# Patient Record
Sex: Female | Born: 1980 | Hispanic: Refuse to answer | Marital: Single | Smoking: Never smoker
Health system: Southern US, Community
[De-identification: ages and names within clinical notes are randomized; demographics above are authoritative.]

## PROBLEM LIST (undated history)

## (undated) DIAGNOSIS — D649 Anemia, unspecified: Secondary | ICD-10-CM

## (undated) DIAGNOSIS — O24419 Gestational diabetes mellitus in pregnancy, unspecified control: Secondary | ICD-10-CM

## (undated) DIAGNOSIS — O139 Gestational [pregnancy-induced] hypertension without significant proteinuria, unspecified trimester: Secondary | ICD-10-CM

## (undated) DIAGNOSIS — F419 Anxiety disorder, unspecified: Secondary | ICD-10-CM

## (undated) DIAGNOSIS — Z8632 Personal history of gestational diabetes: Secondary | ICD-10-CM

## (undated) HISTORY — PX: LEEP: SHX91

## (undated) HISTORY — PX: TONSILLECTOMY: SUR1361

## (undated) HISTORY — DX: Anxiety disorder, unspecified: F41.9

## (undated) HISTORY — PX: BREAST REDUCTION SURGERY: SHX8

## (undated) HISTORY — PX: BREAST SURGERY: SHX581

---

## 2015-12-22 ENCOUNTER — Other Ambulatory Visit (HOSPITAL_COMMUNITY)
Admission: RE | Admit: 2015-12-22 | Discharge: 2015-12-22 | Disposition: A | Discharge status: Home / Self Care | Payer: Managed Care, Other (non HMO) | Source: Ambulatory Visit | Attending: Family Medicine | Admitting: Family Medicine

## 2015-12-22 DIAGNOSIS — Z01411 Encounter for gynecological examination (general) (routine) with abnormal findings: Secondary | ICD-10-CM | POA: Diagnosis not present

## 2016-03-02 ENCOUNTER — Other Ambulatory Visit: Payer: Self-pay | Admitting: Family Medicine

## 2016-03-02 DIAGNOSIS — R110 Nausea: Secondary | ICD-10-CM

## 2016-03-02 DIAGNOSIS — R1011 Right upper quadrant pain: Secondary | ICD-10-CM

## 2016-03-09 ENCOUNTER — Ambulatory Visit
Admission: RE | Admit: 2016-03-09 | Discharge: 2016-03-09 | Disposition: A | Discharge status: Home / Self Care | Payer: Managed Care, Other (non HMO) | Source: Ambulatory Visit | Attending: Family Medicine | Admitting: Family Medicine

## 2016-03-09 DIAGNOSIS — R110 Nausea: Secondary | ICD-10-CM

## 2016-03-09 DIAGNOSIS — R1011 Right upper quadrant pain: Secondary | ICD-10-CM

## 2016-03-15 ENCOUNTER — Other Ambulatory Visit: Payer: Managed Care, Other (non HMO)

## 2017-04-04 DIAGNOSIS — F411 Generalized anxiety disorder: Secondary | ICD-10-CM | POA: Diagnosis not present

## 2017-04-04 DIAGNOSIS — I8391 Asymptomatic varicose veins of right lower extremity: Secondary | ICD-10-CM | POA: Diagnosis not present

## 2017-04-04 DIAGNOSIS — K219 Gastro-esophageal reflux disease without esophagitis: Secondary | ICD-10-CM | POA: Diagnosis not present

## 2017-04-04 DIAGNOSIS — M25561 Pain in right knee: Secondary | ICD-10-CM | POA: Diagnosis not present

## 2017-04-28 DIAGNOSIS — K219 Gastro-esophageal reflux disease without esophagitis: Secondary | ICD-10-CM | POA: Diagnosis not present

## 2017-04-28 DIAGNOSIS — Z0000 Encounter for general adult medical examination without abnormal findings: Secondary | ICD-10-CM | POA: Diagnosis not present

## 2017-04-28 DIAGNOSIS — K760 Fatty (change of) liver, not elsewhere classified: Secondary | ICD-10-CM | POA: Diagnosis not present

## 2017-04-28 DIAGNOSIS — R635 Abnormal weight gain: Secondary | ICD-10-CM | POA: Diagnosis not present

## 2017-04-28 DIAGNOSIS — F411 Generalized anxiety disorder: Secondary | ICD-10-CM | POA: Diagnosis not present

## 2017-04-28 DIAGNOSIS — N920 Excessive and frequent menstruation with regular cycle: Secondary | ICD-10-CM | POA: Diagnosis not present

## 2017-04-28 DIAGNOSIS — R7301 Impaired fasting glucose: Secondary | ICD-10-CM | POA: Diagnosis not present

## 2017-05-14 DIAGNOSIS — J029 Acute pharyngitis, unspecified: Secondary | ICD-10-CM | POA: Diagnosis not present

## 2017-05-14 DIAGNOSIS — J069 Acute upper respiratory infection, unspecified: Secondary | ICD-10-CM | POA: Diagnosis not present

## 2017-05-26 DIAGNOSIS — J209 Acute bronchitis, unspecified: Secondary | ICD-10-CM | POA: Diagnosis not present

## 2017-09-05 NOTE — L&D Delivery Note (Signed)
Delivery Note At 9:33 PM a viable female was delivered via Vaginal, Spontaneous (Presentation:occiput anterior  ; Nuchal cord x1 reduced.  ).  APGAR: 9, 9; weight pending.  .   Placenta status: complete  , 3 vessel .  Cord:  with the following complications: None.  Cord pH: NA  Anesthesia:  Epidural  Episiotomy:  None Lacerations: 2nd degree;Perineal Suture Repair: 3.0 vicryl Est. Blood Loss (mL): 200 mL   Mom to postpartum.  Baby to Couplet care / Skin to Skin.  Aaminah Forrester J. 05/14/2018, 9:57 PM

## 2017-09-07 DIAGNOSIS — L209 Atopic dermatitis, unspecified: Secondary | ICD-10-CM | POA: Diagnosis not present

## 2017-10-06 DIAGNOSIS — Z3481 Encounter for supervision of other normal pregnancy, first trimester: Secondary | ICD-10-CM | POA: Diagnosis not present

## 2017-10-06 DIAGNOSIS — Z3480 Encounter for supervision of other normal pregnancy, unspecified trimester: Secondary | ICD-10-CM | POA: Diagnosis not present

## 2017-10-24 ENCOUNTER — Other Ambulatory Visit (HOSPITAL_COMMUNITY)
Admission: RE | Admit: 2017-10-24 | Discharge: 2017-10-24 | Disposition: A | Discharge status: Home / Self Care | Payer: Medicaid Other | Source: Ambulatory Visit | Attending: Obstetrics and Gynecology | Admitting: Obstetrics and Gynecology

## 2017-10-24 ENCOUNTER — Other Ambulatory Visit: Payer: Self-pay | Admitting: Obstetrics and Gynecology

## 2017-10-24 DIAGNOSIS — Z124 Encounter for screening for malignant neoplasm of cervix: Secondary | ICD-10-CM | POA: Insufficient documentation

## 2017-10-26 LAB — CYTOLOGY - PAP
DIAGNOSIS: NEGATIVE
HPV: NOT DETECTED

## 2017-11-08 DIAGNOSIS — O161 Unspecified maternal hypertension, first trimester: Secondary | ICD-10-CM | POA: Diagnosis not present

## 2017-11-08 DIAGNOSIS — O09521 Supervision of elderly multigravida, first trimester: Secondary | ICD-10-CM | POA: Diagnosis not present

## 2017-11-08 DIAGNOSIS — R51 Headache: Secondary | ICD-10-CM | POA: Diagnosis not present

## 2017-11-08 DIAGNOSIS — Z3481 Encounter for supervision of other normal pregnancy, first trimester: Secondary | ICD-10-CM | POA: Diagnosis not present

## 2017-12-18 DIAGNOSIS — O09521 Supervision of elderly multigravida, first trimester: Secondary | ICD-10-CM | POA: Diagnosis not present

## 2017-12-18 DIAGNOSIS — O09522 Supervision of elderly multigravida, second trimester: Secondary | ICD-10-CM | POA: Diagnosis not present

## 2018-01-11 DIAGNOSIS — Z36 Encounter for antenatal screening for chromosomal anomalies: Secondary | ICD-10-CM | POA: Diagnosis not present

## 2018-01-11 DIAGNOSIS — O09522 Supervision of elderly multigravida, second trimester: Secondary | ICD-10-CM | POA: Diagnosis not present

## 2018-01-11 DIAGNOSIS — Z3482 Encounter for supervision of other normal pregnancy, second trimester: Secondary | ICD-10-CM | POA: Diagnosis not present

## 2018-01-19 DIAGNOSIS — H6992 Unspecified Eustachian tube disorder, left ear: Secondary | ICD-10-CM | POA: Diagnosis not present

## 2018-03-02 DIAGNOSIS — Z3483 Encounter for supervision of other normal pregnancy, third trimester: Secondary | ICD-10-CM | POA: Diagnosis not present

## 2018-03-02 DIAGNOSIS — Z3480 Encounter for supervision of other normal pregnancy, unspecified trimester: Secondary | ICD-10-CM | POA: Diagnosis not present

## 2018-03-09 DIAGNOSIS — R7309 Other abnormal glucose: Secondary | ICD-10-CM | POA: Diagnosis not present

## 2018-03-09 DIAGNOSIS — O163 Unspecified maternal hypertension, third trimester: Secondary | ICD-10-CM | POA: Diagnosis not present

## 2018-03-14 ENCOUNTER — Ambulatory Visit: Payer: Medicaid Other

## 2018-03-21 ENCOUNTER — Ambulatory Visit: Payer: Medicaid Other

## 2018-03-22 ENCOUNTER — Encounter (HOSPITAL_COMMUNITY): Payer: Self-pay

## 2018-03-22 ENCOUNTER — Ambulatory Visit (HOSPITAL_COMMUNITY): Payer: Medicaid Other | Attending: Obstetrics and Gynecology

## 2018-03-29 ENCOUNTER — Encounter: Payer: BLUE CROSS/BLUE SHIELD | Attending: Obstetrics and Gynecology | Admitting: *Deleted

## 2018-03-29 ENCOUNTER — Ambulatory Visit (HOSPITAL_COMMUNITY)
Admission: RE | Admit: 2018-03-29 | Discharge: 2018-03-29 | Disposition: A | Discharge status: Home / Self Care | Payer: BLUE CROSS/BLUE SHIELD | Source: Ambulatory Visit | Attending: Obstetrics and Gynecology | Admitting: Obstetrics and Gynecology

## 2018-03-29 DIAGNOSIS — Z713 Dietary counseling and surveillance: Secondary | ICD-10-CM | POA: Diagnosis not present

## 2018-03-29 DIAGNOSIS — R7302 Impaired glucose tolerance (oral): Secondary | ICD-10-CM | POA: Diagnosis not present

## 2018-03-29 NOTE — Progress Notes (Signed)
  Patient was seen on 03/29/2018 for Gestational Diabetes self-management. EDD 05/30/2018. Patient states no history of GDM. Diet history obtained. Patient eats good variety of all food groups. Beverages include water and diet soda.  She questioned her diagnosis with her post meal results being within target ranges but only the Fasting glucose was too hight. I explained that all glucose values are affecting the baby, so it will be important to get the fasting numbers down too. The following learning objectives were met by the patient :   States the definition of Gestational Diabetes  States why dietary management is important in controlling blood glucose  Describes the effects of carbohydrates on blood glucose levels  Demonstrates ability to create a balanced meal plan  Demonstrates carbohydrate counting   States when to check blood glucose levels  Demonstrates proper blood glucose monitoring techniques  States the effect of stress and exercise on blood glucose levels  States the importance of limiting caffeine and abstaining from alcohol and smoking  Plan:  Aim for 3 Carb Choices per meal (45 grams) +/- 1 either way  Aim for 1-2 Carbs per snack Begin reading food labels for Total Carbohydrate of foods If OK with your MD, consider  increasing your activity level by walking, Arm Chair Exercises or other activity daily as tolerated Continue checking BG before breakfast and 2 hours after first bite of breakfast, lunch and dinner as directed by MD  Bring Log Book/Sheet to every medical appointment   Take medication if directed by MD  Patient already has a meter And is testing pre breakfast and 2 hours after each meal as directed by MD Review of Log Book shows: Fasting BG consistently too high, ranging from 99-120 mg/dl     2 hour post meal BG consistently within range except when ate grapes within the 2 hour time   Patient instructed to monitor glucose levels: FBS: 60 - 95 mg/dl 2  hour: <120 mg/dl  Patient received the following handouts:  Nutrition Diabetes and Pregnancy  Carbohydrate Counting List  Patient will be seen for follow-up as needed.

## 2018-04-02 ENCOUNTER — Other Ambulatory Visit: Payer: Self-pay

## 2018-04-05 DIAGNOSIS — O24415 Gestational diabetes mellitus in pregnancy, controlled by oral hypoglycemic drugs: Secondary | ICD-10-CM | POA: Diagnosis not present

## 2018-04-05 DIAGNOSIS — R3915 Urgency of urination: Secondary | ICD-10-CM | POA: Diagnosis not present

## 2018-04-09 DIAGNOSIS — O24415 Gestational diabetes mellitus in pregnancy, controlled by oral hypoglycemic drugs: Secondary | ICD-10-CM | POA: Diagnosis not present

## 2018-04-09 DIAGNOSIS — O09523 Supervision of elderly multigravida, third trimester: Secondary | ICD-10-CM | POA: Diagnosis not present

## 2018-04-09 DIAGNOSIS — O10013 Pre-existing essential hypertension complicating pregnancy, third trimester: Secondary | ICD-10-CM | POA: Diagnosis not present

## 2018-04-17 ENCOUNTER — Encounter: Payer: BLUE CROSS/BLUE SHIELD | Attending: Obstetrics and Gynecology | Admitting: *Deleted

## 2018-04-17 DIAGNOSIS — Z713 Dietary counseling and surveillance: Secondary | ICD-10-CM | POA: Diagnosis not present

## 2018-04-17 DIAGNOSIS — R7302 Impaired glucose tolerance (oral): Secondary | ICD-10-CM

## 2018-04-17 DIAGNOSIS — O24415 Gestational diabetes mellitus in pregnancy, controlled by oral hypoglycemic drugs: Secondary | ICD-10-CM | POA: Diagnosis not present

## 2018-04-17 NOTE — Progress Notes (Signed)
Insulin Instruction  Patient was seen on 04/17/2018 for insulin instruction.  The following learning objectives were met by the patient during this visit:   Insulin Action of NPH insulins  Reviewed syringe & vial including # units per syringe,    length of needles, vial  Hygiene and storage  Drawing up single and mixed doses if using vials   Single dose      Rotation of Sites  Hypoglycemia- symptoms, causes , treatment choices  Record keeping and MD follow up   Patient demonstrated understanding of insulin administration by return demonstration.  Patient received the following handouts:  Insulin Instruction Handout                                        Patient to start on insulin as Rx'd by MD  Patient will be seen for follow-up as needed.

## 2018-04-23 DIAGNOSIS — O24415 Gestational diabetes mellitus in pregnancy, controlled by oral hypoglycemic drugs: Secondary | ICD-10-CM | POA: Diagnosis not present

## 2018-04-26 DIAGNOSIS — O24415 Gestational diabetes mellitus in pregnancy, controlled by oral hypoglycemic drugs: Secondary | ICD-10-CM | POA: Diagnosis not present

## 2018-05-03 ENCOUNTER — Inpatient Hospital Stay (HOSPITAL_COMMUNITY)
Admission: AD | Admit: 2018-05-03 | Discharge: 2018-05-03 | Disposition: A | Discharge status: Home / Self Care | Payer: BLUE CROSS/BLUE SHIELD | Attending: Obstetrics and Gynecology | Admitting: Obstetrics and Gynecology

## 2018-05-03 ENCOUNTER — Encounter (HOSPITAL_COMMUNITY): Payer: Self-pay

## 2018-05-03 DIAGNOSIS — O479 False labor, unspecified: Secondary | ICD-10-CM | POA: Insufficient documentation

## 2018-05-03 DIAGNOSIS — O09523 Supervision of elderly multigravida, third trimester: Secondary | ICD-10-CM | POA: Diagnosis not present

## 2018-05-03 DIAGNOSIS — O3433 Maternal care for cervical incompetence, third trimester: Secondary | ICD-10-CM

## 2018-05-03 HISTORY — DX: Gestational (pregnancy-induced) hypertension without significant proteinuria, unspecified trimester: O13.9

## 2018-05-03 HISTORY — DX: Anemia, unspecified: D64.9

## 2018-05-03 HISTORY — DX: Gestational diabetes mellitus in pregnancy, unspecified control: O24.419

## 2018-05-03 LAB — URINALYSIS, ROUTINE W REFLEX MICROSCOPIC
BILIRUBIN URINE: NEGATIVE
GLUCOSE, UA: NEGATIVE mg/dL
HGB URINE DIPSTICK: NEGATIVE
KETONES UR: NEGATIVE mg/dL
Nitrite: NEGATIVE
PH: 6 (ref 5.0–8.0)
PROTEIN: NEGATIVE mg/dL
Specific Gravity, Urine: 1.008 (ref 1.005–1.030)

## 2018-05-03 NOTE — MAU Note (Signed)
I have communicated with Dr. Dion BodyVarnado and reviewed vital signs:  Vitals:   05/03/18 1500 05/03/18 1503  BP:  128/68  Pulse:  93  Resp:    Temp:    SpO2: 99% 99%    Vaginal exam:  Dilation: 2 Effacement (%): 40 Cervical Position: Posterior Station: -3 Exam by:: Ginnie Smartachel Buck Mcaffee RN,   Also reviewed contraction pattern and that non-stress test is reactive.  It has been documented that patient is not showing contracts with no cervical change over 2 hours not indicating active labor.  Patient denies any other complaints.  Based on this report provider has given order for discharge.  A discharge order and diagnosis entered by a provider.   Labor discharge instructions reviewed with patient.

## 2018-05-03 NOTE — MAU Note (Signed)
Urine in lab 

## 2018-05-03 NOTE — MAU Note (Signed)
Pt at the office today, 3-4 cm and having ctx, but pt not feeling it. No LOF or bleeding, +FM. GDM on insulin.

## 2018-05-04 DIAGNOSIS — O10013 Pre-existing essential hypertension complicating pregnancy, third trimester: Secondary | ICD-10-CM | POA: Diagnosis not present

## 2018-05-04 DIAGNOSIS — O09523 Supervision of elderly multigravida, third trimester: Secondary | ICD-10-CM | POA: Diagnosis not present

## 2018-05-04 DIAGNOSIS — O24415 Gestational diabetes mellitus in pregnancy, controlled by oral hypoglycemic drugs: Secondary | ICD-10-CM | POA: Diagnosis not present

## 2018-05-04 DIAGNOSIS — O163 Unspecified maternal hypertension, third trimester: Secondary | ICD-10-CM | POA: Diagnosis not present

## 2018-05-04 LAB — URINE CULTURE

## 2018-05-10 DIAGNOSIS — O24415 Gestational diabetes mellitus in pregnancy, controlled by oral hypoglycemic drugs: Secondary | ICD-10-CM | POA: Diagnosis not present

## 2018-05-10 DIAGNOSIS — O163 Unspecified maternal hypertension, third trimester: Secondary | ICD-10-CM | POA: Diagnosis not present

## 2018-05-14 ENCOUNTER — Inpatient Hospital Stay (HOSPITAL_COMMUNITY): Payer: BLUE CROSS/BLUE SHIELD | Admitting: Anesthesiology

## 2018-05-14 ENCOUNTER — Encounter (HOSPITAL_COMMUNITY): Payer: Self-pay

## 2018-05-14 ENCOUNTER — Other Ambulatory Visit: Payer: Self-pay

## 2018-05-14 ENCOUNTER — Inpatient Hospital Stay (HOSPITAL_COMMUNITY)
Admission: AD | Admit: 2018-05-14 | Discharge: 2018-05-16 | DRG: 807 | Disposition: A | Discharge status: Home / Self Care | Payer: BLUE CROSS/BLUE SHIELD | Attending: Obstetrics and Gynecology | Admitting: Obstetrics and Gynecology

## 2018-05-14 DIAGNOSIS — K429 Umbilical hernia without obstruction or gangrene: Secondary | ICD-10-CM | POA: Diagnosis not present

## 2018-05-14 DIAGNOSIS — O10913 Unspecified pre-existing hypertension complicating pregnancy, third trimester: Secondary | ICD-10-CM

## 2018-05-14 DIAGNOSIS — O139 Gestational [pregnancy-induced] hypertension without significant proteinuria, unspecified trimester: Secondary | ICD-10-CM

## 2018-05-14 DIAGNOSIS — O10919 Unspecified pre-existing hypertension complicating pregnancy, unspecified trimester: Secondary | ICD-10-CM | POA: Diagnosis not present

## 2018-05-14 DIAGNOSIS — O1002 Pre-existing essential hypertension complicating childbirth: Principal | ICD-10-CM | POA: Diagnosis present

## 2018-05-14 DIAGNOSIS — Z880 Allergy status to penicillin: Secondary | ICD-10-CM | POA: Diagnosis not present

## 2018-05-14 DIAGNOSIS — O24424 Gestational diabetes mellitus in childbirth, insulin controlled: Secondary | ICD-10-CM | POA: Diagnosis not present

## 2018-05-14 DIAGNOSIS — D649 Anemia, unspecified: Secondary | ICD-10-CM | POA: Diagnosis present

## 2018-05-14 DIAGNOSIS — O134 Gestational [pregnancy-induced] hypertension without significant proteinuria, complicating childbirth: Secondary | ICD-10-CM | POA: Diagnosis not present

## 2018-05-14 DIAGNOSIS — O24429 Gestational diabetes mellitus in childbirth, unspecified control: Secondary | ICD-10-CM | POA: Diagnosis not present

## 2018-05-14 DIAGNOSIS — O9902 Anemia complicating childbirth: Secondary | ICD-10-CM | POA: Diagnosis present

## 2018-05-14 DIAGNOSIS — Z23 Encounter for immunization: Secondary | ICD-10-CM | POA: Diagnosis not present

## 2018-05-14 DIAGNOSIS — Z3A37 37 weeks gestation of pregnancy: Secondary | ICD-10-CM | POA: Diagnosis not present

## 2018-05-14 DIAGNOSIS — R011 Cardiac murmur, unspecified: Secondary | ICD-10-CM | POA: Diagnosis not present

## 2018-05-14 LAB — COMPREHENSIVE METABOLIC PANEL
ALBUMIN: 2.7 g/dL — AB (ref 3.5–5.0)
ALK PHOS: 93 U/L (ref 38–126)
ALT: 14 U/L (ref 0–44)
AST: 16 U/L (ref 15–41)
Anion gap: 10 (ref 5–15)
BUN: 13 mg/dL (ref 6–20)
CALCIUM: 9.1 mg/dL (ref 8.9–10.3)
CO2: 18 mmol/L — ABNORMAL LOW (ref 22–32)
CREATININE: 0.44 mg/dL (ref 0.44–1.00)
Chloride: 105 mmol/L (ref 98–111)
GFR calc Af Amer: >60 mL/min (ref >60)
GFR calc non Af Amer: >60 mL/min (ref >60)
GLUCOSE: 98 mg/dL (ref 70–99)
Potassium: 4.4 mmol/L (ref 3.5–5.1)
Sodium: 133 mmol/L — ABNORMAL LOW (ref 135–145)
TOTAL PROTEIN: 5.9 g/dL — AB (ref 6.5–8.1)

## 2018-05-14 LAB — URINALYSIS, ROUTINE W REFLEX MICROSCOPIC
BILIRUBIN URINE: NEGATIVE
GLUCOSE, UA: 50 mg/dL — AB
Ketones, ur: NEGATIVE mg/dL
NITRITE: NEGATIVE
PROTEIN: NEGATIVE mg/dL
SPECIFIC GRAVITY, URINE: 1.014 (ref 1.005–1.030)
pH: 6 (ref 5.0–8.0)

## 2018-05-14 LAB — CBC WITH DIFFERENTIAL/PLATELET
BASOS ABS: 0 10*3/uL (ref 0.0–0.1)
BASOS PCT: 0 %
Eosinophils Absolute: 0 10*3/uL (ref 0.0–0.7)
Eosinophils Relative: 0 %
HEMATOCRIT: 33.6 % — AB (ref 36.0–46.0)
HEMOGLOBIN: 11.1 g/dL — AB (ref 12.0–15.0)
LYMPHS PCT: 19 %
Lymphs Abs: 1.9 10*3/uL (ref 0.7–4.0)
MCH: 27.4 pg (ref 26.0–34.0)
MCHC: 33 g/dL (ref 30.0–36.0)
MCV: 83 fL (ref 78.0–100.0)
MONOS PCT: 2 %
Monocytes Absolute: 0.2 10*3/uL (ref 0.1–1.0)
Neutro Abs: 7.7 10*3/uL (ref 1.7–7.7)
Neutrophils Relative %: 79 %
Platelets: 255 10*3/uL (ref 150–400)
RBC: 4.05 MIL/uL (ref 3.87–5.11)
RDW: 13.4 % (ref 11.5–15.5)
WBC: 9.9 10*3/uL (ref 4.0–10.5)

## 2018-05-14 LAB — CBC
HEMATOCRIT: 33.4 % — AB (ref 36.0–46.0)
HEMOGLOBIN: 11 g/dL — AB (ref 12.0–15.0)
MCH: 27.2 pg (ref 26.0–34.0)
MCHC: 32.9 g/dL (ref 30.0–36.0)
MCV: 82.7 fL (ref 78.0–100.0)
Platelets: 241 10*3/uL (ref 150–400)
RBC: 4.04 MIL/uL (ref 3.87–5.11)
RDW: 13.3 % (ref 11.5–15.5)
WBC: 16 10*3/uL — ABNORMAL HIGH (ref 4.0–10.5)

## 2018-05-14 LAB — TYPE AND SCREEN
ABO/RH(D): A POS
ANTIBODY SCREEN: NEGATIVE

## 2018-05-14 LAB — ABO/RH: ABO/RH(D): A POS

## 2018-05-14 LAB — PROTEIN / CREATININE RATIO, URINE
Creatinine, Urine: 79 mg/dL
Protein Creatinine Ratio: 0.18 mg/mg{Cre} — ABNORMAL HIGH (ref 0.00–0.15)
Total Protein, Urine: 14 mg/dL

## 2018-05-14 LAB — GLUCOSE, CAPILLARY
Glucose-Capillary: 88 mg/dL (ref 70–99)
Glucose-Capillary: 98 mg/dL (ref 70–99)

## 2018-05-14 LAB — POCT FERN TEST: POCT Fern Test: NEGATIVE

## 2018-05-14 MED ORDER — LACTATED RINGERS IV SOLN
INTRAVENOUS | Status: DC
  Administered 2018-05-14 (×3): via INTRAVENOUS

## 2018-05-14 MED ORDER — LACTATED RINGERS IV SOLN
500.0000 mL | INTRAVENOUS | Status: DC | PRN
  Administered 2018-05-14: 500 mL via INTRAVENOUS

## 2018-05-14 MED ORDER — ACETAMINOPHEN 325 MG PO TABS: 650.0000 mg | ORAL_TABLET | ORAL | Status: DC | PRN

## 2018-05-14 MED ORDER — SODIUM BICARBONATE 8.4 % IV SOLN
INTRAVENOUS | Status: DC | PRN
  Administered 2018-05-14: 4 mL via EPIDURAL
  Administered 2018-05-14: 3 mL via EPIDURAL
  Administered 2018-05-14: 4 mL via EPIDURAL

## 2018-05-14 MED ORDER — HYDROXYZINE HCL 50 MG PO TABS
50.0000 mg | ORAL_TABLET | Freq: Four times a day (QID) | ORAL | Status: DC | PRN
  Filled 2018-05-14: qty 1

## 2018-05-14 MED ORDER — EPHEDRINE 5 MG/ML INJ
10.0000 mg | INTRAVENOUS | Status: DC | PRN
  Filled 2018-05-14: qty 2

## 2018-05-14 MED ORDER — OXYTOCIN 40 UNITS IN LACTATED RINGERS INFUSION - SIMPLE MED
1.0000 m[IU]/min | INTRAVENOUS | Status: DC
  Administered 2018-05-14: 2 m[IU]/min via INTRAVENOUS
  Filled 2018-05-14: qty 1000

## 2018-05-14 MED ORDER — FENTANYL 2.5 MCG/ML BUPIVACAINE 1/10 % EPIDURAL INFUSION (WH - ANES)
14.0000 mL/h | INTRAMUSCULAR | Status: DC | PRN
  Administered 2018-05-14: 14 mL/h via EPIDURAL
  Filled 2018-05-14: qty 100

## 2018-05-14 MED ORDER — LACTATED RINGERS IV SOLN
500.0000 mL | Freq: Once | INTRAVENOUS | Status: AC
  Administered 2018-05-14: 500 mL via INTRAVENOUS

## 2018-05-14 MED ORDER — OXYTOCIN BOLUS FROM INFUSION
500.0000 mL | Freq: Once | INTRAVENOUS | Status: AC
  Administered 2018-05-14: 500 mL via INTRAVENOUS

## 2018-05-14 MED ORDER — SOD CITRATE-CITRIC ACID 500-334 MG/5ML PO SOLN: 30.0000 mL | ORAL | Status: DC | PRN

## 2018-05-14 MED ORDER — ONDANSETRON HCL 4 MG/2ML IJ SOLN: 4.0000 mg | Freq: Four times a day (QID) | INTRAMUSCULAR | Status: DC | PRN

## 2018-05-14 MED ORDER — LIDOCAINE HCL (PF) 1 % IJ SOLN
30.0000 mL | INTRAMUSCULAR | Status: DC | PRN
  Filled 2018-05-14: qty 30

## 2018-05-14 MED ORDER — PHENYLEPHRINE 40 MCG/ML (10ML) SYRINGE FOR IV PUSH (FOR BLOOD PRESSURE SUPPORT)
80.0000 ug | PREFILLED_SYRINGE | INTRAVENOUS | Status: DC | PRN
  Filled 2018-05-14: qty 5

## 2018-05-14 MED ORDER — DIPHENHYDRAMINE HCL 50 MG/ML IJ SOLN: 12.5000 mg | INTRAMUSCULAR | Status: DC | PRN

## 2018-05-14 MED ORDER — FENTANYL CITRATE (PF) 100 MCG/2ML IJ SOLN
50.0000 ug | INTRAMUSCULAR | Status: DC | PRN
  Administered 2018-05-14: 50 ug via INTRAVENOUS
  Filled 2018-05-14: qty 2

## 2018-05-14 MED ORDER — OXYCODONE-ACETAMINOPHEN 5-325 MG PO TABS: 1.0000 | ORAL_TABLET | ORAL | Status: DC | PRN

## 2018-05-14 MED ORDER — OXYCODONE-ACETAMINOPHEN 5-325 MG PO TABS: 2.0000 | ORAL_TABLET | ORAL | Status: DC | PRN

## 2018-05-14 MED ORDER — PHENYLEPHRINE 40 MCG/ML (10ML) SYRINGE FOR IV PUSH (FOR BLOOD PRESSURE SUPPORT)
80.0000 ug | PREFILLED_SYRINGE | INTRAVENOUS | Status: DC | PRN
  Filled 2018-05-14: qty 10
  Filled 2018-05-14: qty 5

## 2018-05-14 MED ORDER — OXYTOCIN 40 UNITS IN LACTATED RINGERS INFUSION - SIMPLE MED: 2.5000 [IU]/h | INTRAVENOUS | Status: DC

## 2018-05-14 NOTE — Anesthesia Preprocedure Evaluation (Signed)
Anesthesia Evaluation  Patient identified by MRN, date of birth, ID band Patient awake    Reviewed: Allergy & Precautions, NPO status , Patient's Chart, lab work & pertinent test results  Airway Mallampati: II  TM Distance: >3 FB Neck ROM: Full    Dental no notable dental hx.    Pulmonary    Pulmonary exam normal breath sounds clear to auscultation       Cardiovascular Normal cardiovascular exam Rhythm:Regular Rate:Normal     Neuro/Psych negative neurological ROS  negative psych ROS   GI/Hepatic negative GI ROS, Neg liver ROS,   Endo/Other  diabetes, Gestational  Renal/GU negative Renal ROS  negative genitourinary   Musculoskeletal negative musculoskeletal ROS (+)   Abdominal   Peds  Hematology negative hematology ROS (+) anemia ,   Anesthesia Other Findings   Reproductive/Obstetrics gHTN                             Anesthesia Physical Anesthesia Plan  ASA: III  Anesthesia Plan: Epidural   Post-op Pain Management:    Induction:   PONV Risk Score and Plan: Treatment may vary due to age or medical condition  Airway Management Planned: Natural Airway  Additional Equipment:   Intra-op Plan:   Post-operative Plan:   Informed Consent:   Plan Discussed with:   Anesthesia Plan Comments: (Patient identified. Risks, benefits, options discussed with patient including but not limited to bleeding, infection, nerve damage, paralysis, failed block, incomplete pain control, headache, blood pressure changes, nausea, vomiting, reactions to medication, itching, and post partum back pain. Confirmed with bedside nurse the patient's most recent platelet count. Confirmed with the patient that they are not taking any anticoagulation, have any bleeding history or any family history of bleeding disorders. Patient expressed understanding and wishes to proceed. All questions were answered. )         Anesthesia Quick Evaluation

## 2018-05-14 NOTE — Anesthesia Procedure Notes (Signed)
Epidural Patient location during procedure: OB Start time: 05/14/2018 7:35 PM End time: 05/14/2018 7:50 PM  Staffing Anesthesiologist: Elmer Picker, MD Performed: anesthesiologist   Preanesthetic Checklist Completed: patient identified, pre-op evaluation, timeout performed, IV checked, risks and benefits discussed and monitors and equipment checked  Epidural Patient position: sitting Prep: site prepped and draped and DuraPrep Patient monitoring: continuous pulse ox, blood pressure, heart rate and cardiac monitor Approach: midline Location: L3-L4 Injection technique: LOR air  Needle:  Needle type: Tuohy  Needle gauge: 17 G Needle length: 9 cm Needle insertion depth: 6 cm Catheter type: closed end flexible Catheter size: 19 Gauge Catheter at skin depth: 11 cm Test dose: negative  Assessment Sensory level: T8 Events: blood not aspirated, injection not painful, no injection resistance, negative IV test and no paresthesia  Additional Notes Patient identified. Risks/Benefits/Options discussed with patient including but not limited to bleeding, infection, nerve damage, paralysis, failed block, incomplete pain control, headache, blood pressure changes, nausea, vomiting, reactions to medication both or allergic, itching and postpartum back pain. Confirmed with bedside nurse the patient's most recent platelet count. Confirmed with patient that they are not currently taking any anticoagulation, have any bleeding history or any family history of bleeding disorders. Patient expressed understanding and wished to proceed. All questions were answered. Sterile technique was used throughout the entire procedure. Please see nursing notes for vital signs. Test dose was given through epidural catheter and negative prior to continuing to dose epidural or start infusion. Warning signs of high block given to the patient including shortness of breath, tingling/numbness in hands, complete motor block, or  any concerning symptoms with instructions to call for help. Patient was given instructions on fall risk and not to get out of bed. All questions and concerns addressed with instructions to call with any issues or inadequate analgesia.  Reason for block:procedure for pain

## 2018-05-14 NOTE — H&P (Signed)
Kendra Stewart is a 37 y.o. female G1P0 at 37 wks and 5 days presenting for rule out ROM. Pt ruled out for ROM however she had elevated bp on arrival. Her pregnancy is complicated by h/o Chronic hypertension/ Gestational diabetes ( insulin dependent ) and advanced maternal age. Prenatal care provided by Dr. Geryl Rankins with [facility].   . OB History    Gravida  1   Para      Term      Preterm      AB      Living        SAB      TAB      Ectopic      Multiple      Live Births             Past Medical History:  Diagnosis Date  . Anemia   . Gestational diabetes   . Pregnancy induced hypertension    early in pregnancy  Depression   Past Surgical History:  Procedure Laterality Date  . BREAST REDUCTION SURGERY    . TONSILLECTOMY    H/O LEEP procedure  Family History: family history is not on file. Social History:  reports that she has never smoked. She has never used smokeless tobacco. She reports that she drank alcohol. She reports that she does not use drugs.     Maternal Diabetes: Yes:  Diabetes Type:  Insulin/Medication controlled Genetic Screening: Normal Maternal Ultrasounds/Referrals: Normal Fetal Ultrasounds or other Referrals:  None Maternal Substance Abuse:  No Significant Maternal Medications:  Meds include: Other:  Significant Maternal Lab Results:  Lab values include: Group B Strep negative Other Comments:  None  Review of Systems  Constitutional: Negative.   HENT: Negative.   Eyes: Negative.   Respiratory: Negative.   Cardiovascular: Negative.   Gastrointestinal: Negative.   Genitourinary: Negative.   Musculoskeletal: Negative.   Skin: Negative.   Neurological: Negative.   Endo/Heme/Allergies: Negative.   Psychiatric/Behavioral: Negative.    Maternal Medical History:  Fetal activity: Perceived fetal activity is normal.    Prenatal complications: PIH.   Prenatal Complications - Diabetes: gestational. Diabetes is managed by  insulin injections.      Dilation: 4 Effacement (%): 90 Station: -2 Exam by:: [employer] rnc Blood pressure (!) 138/96, pulse 87, temperature 97.8 F (36.6 C), temperature source Oral, resp. rate 20, height 5\' 8"  (1.727 m), weight 97.1 kg, SpO2 100 %. Maternal Exam:  Uterine Assessment: Contraction strength is moderate.  Contraction frequency is regular.   Abdomen: Patient reports no abdominal tenderness. Fetal presentation: vertex  Introitus: Normal vulva. Normal vagina.  Ferning test: negative.      Fetal Exam Fetal Monitor Review: Baseline rate: 130.  Variability: minimal (<5 bpm).   Pattern: variable decelerations.    Fetal State Assessment: Category II - tracings are indeterminate.     Physical Exam  Vitals reviewed. Constitutional: She is oriented to person, place, and time. She appears well-developed and well-nourished.  HENT:  Head: Normocephalic and atraumatic.  Eyes: Pupils are equal, round, and reactive to light. Conjunctivae are normal.  Neck: Normal range of motion. Neck supple.  Cardiovascular: Normal rate and regular rhythm.  Respiratory: Effort normal and breath sounds normal.  GI: There is no tenderness.  Genitourinary: Vagina normal.  Musculoskeletal: Normal range of motion.  Neurological: She is alert and oriented to person, place, and time. She has normal reflexes.  Skin: Skin is warm and dry.  Psychiatric: She has a normal mood  and affect.    Prenatal labs: ABO, Rh: --/--/A POS (09/09 1547) Antibody: NEG (09/09 1547) Rubella:  Immune  RPR:   Nonreactive  HBsAg:   Negative  HIV:   Negative  GBS:   Negative  Results for orders placed or performed during the hospital encounter of 05/14/18 (from the past 24 hour(s))  Fern Test     Status: None   Collection Time: 05/14/18  3:28 PM  Result Value Ref Range   POCT Fern Test Negative = intact amniotic membranes   Urinalysis, Routine w reflex microscopic     Status: Abnormal   Collection  Time: 05/14/18  3:34 PM  Result Value Ref Range   Color, Urine YELLOW YELLOW   APPearance HAZY (A) CLEAR   Specific Gravity, Urine 1.014 1.005 - 1.030   pH 6.0 5.0 - 8.0   Glucose, UA 50 (A) NEGATIVE mg/dL   Hgb urine dipstick MODERATE (A) NEGATIVE   Bilirubin Urine NEGATIVE NEGATIVE   Ketones, ur NEGATIVE NEGATIVE mg/dL   Protein, ur NEGATIVE NEGATIVE mg/dL   Nitrite NEGATIVE NEGATIVE   Leukocytes, UA TRACE (A) NEGATIVE   RBC / HPF 0-5 0 - 5 RBC/hpf   WBC, UA 6-10 0 - 5 WBC/hpf   Bacteria, UA RARE (A) NONE SEEN   Squamous Epithelial / LPF 0-5 0 - 5   Mucus PRESENT   Protein / creatinine ratio, urine     Status: Abnormal   Collection Time: 05/14/18  3:34 PM  Result Value Ref Range   Creatinine, Urine 79.00 mg/dL   Total Protein, Urine 14 mg/dL   Protein Creatinine Ratio 0.18 (H) 0.00 - 0.15 mg/mg[Cre]  CBC with Differential/Platelet     Status: Abnormal   Collection Time: 05/14/18  3:47 PM  Result Value Ref Range   WBC 9.9 4.0 - 10.5 K/uL   RBC 4.05 3.87 - 5.11 MIL/uL   Hemoglobin 11.1 (L) 12.0 - 15.0 g/dL   HCT 90.3 (L) 00.9 - 23.3 %   MCV 83.0 78.0 - 100.0 fL   MCH 27.4 26.0 - 34.0 pg   MCHC 33.0 30.0 - 36.0 g/dL   RDW 00.7 62.2 - 63.3 %   Platelets 255 150 - 400 K/uL   Neutrophils Relative % 79 %   Neutro Abs 7.7 1.7 - 7.7 K/uL   Lymphocytes Relative 19 %   Lymphs Abs 1.9 0.7 - 4.0 K/uL   Monocytes Relative 2 %   Monocytes Absolute 0.2 0.1 - 1.0 K/uL   Eosinophils Relative 0 %   Eosinophils Absolute 0.0 0.0 - 0.7 K/uL   Basophils Relative 0 %   Basophils Absolute 0.0 0.0 - 0.1 K/uL  Comprehensive metabolic panel     Status: Abnormal   Collection Time: 05/14/18  3:47 PM  Result Value Ref Range   Sodium 133 (L) 135 - 145 mmol/L   Potassium 4.4 3.5 - 5.1 mmol/L   Chloride 105 98 - 111 mmol/L   CO2 18 (L) 22 - 32 mmol/L   Glucose, Bld 98 70 - 99 mg/dL   BUN 13 6 - 20 mg/dL   Creatinine, Ser 3.54 0.44 - 1.00 mg/dL   Calcium 9.1 8.9 - 56.2 mg/dL   Total Protein  5.9 (L) 6.5 - 8.1 g/dL   Albumin 2.7 (L) 3.5 - 5.0 g/dL   AST 16 15 - 41 U/L   ALT 14 0 - 44 U/L   Alkaline Phosphatase 93 38 - 126 U/L   Total Bilirubin <0.1 (L) 0.3 -  1.2 mg/dL   GFR calc non Af Amer >60 >60 mL/min   GFR calc Af Amer >60 >60 mL/min   Anion gap 10 5 - 15  Type and screen [facility]     Status: None   Collection Time: 05/14/18  3:47 PM  Result Value Ref Range   ABO/RH(D) A POS    Antibody Screen NEG    Sample Expiration      05/17/2018 Performed at [facility], [address], [place], [place] [address]   Glucose, capillary     Status: None   Collection Time: 05/14/18  5:40 PM  Result Value Ref Range   Glucose-Capillary 88 70 - 99 mg/dL  Glucose, capillary     Status: None   Collection Time: 05/14/18  6:50 PM  Result Value Ref Range   Glucose-Capillary 98 70 - 99 mg/dL   Assessment/Plan: 37 wks and 5 days with chronic hypertension  type 2 DM pt in latent labor plan to admit to augment Pitocin for augmentation.  Anticipate SVD  Gestational diabetes- check CBG q 4 hours  Epidural for pan control  Category 2 tracing currently plan IV fluid bolus and postion changes plan to monitor closely.  Plan AROM after epidural sooner if FHT remains Category 2.  Dr. Geryl Rankins to Assume care at 7 am.    Sanaa Zilberman J. 05/14/2018, 7:27 PM

## 2018-05-14 NOTE — MAU Note (Signed)
Pt reports she "lost her plug" today, ? Leaking fluid at 1030, cramping

## 2018-05-14 NOTE — MAU Provider Note (Signed)
History     CSN: [id number]  Arrival date and time: 05/14/18 1413   None     Chief Complaint  Patient presents with  . Labor Eval  . Rupture of Membranes  . Abdominal Pain   HPI Kendra Stewart is 37 y.o. G1P0 [redacted]w[redacted]d weeks presenting with report of having lost her mucous plug, + vaginal bleeding,  for fluid leaking down her leg.. Cramping and  + for fetal movement.  BP elevated on admission but down within normal range with subsequent testing.  Patient states her BP was high at the first of the pregnancy but not since.  Hx of chronic hypertension and has gestational diabetes.  Is using insulin.  After exam, she reported to the nurse that she had a slight headache.   Past Medical History:  Diagnosis Date  . Anemia   . Gestational diabetes   . Pregnancy induced hypertension    early in pregnancy    Past Surgical History:  Procedure Laterality Date  . BREAST REDUCTION SURGERY    . TONSILLECTOMY      History reviewed. No pertinent family history.  Social History   Tobacco Use  . Smoking status: Never Smoker  . Smokeless tobacco: Never Used  Substance Use Topics  . Alcohol use: Not Currently    Frequency: Never  . Drug use: Never    Allergies:  Allergies  Allergen Reactions  . Penicillins Hives    Has patient had a PCN reaction causing immediate rash, facial/tongue/throat swelling, SOB or lightheadedness with hypotension: Unknown Has patient had a PCN reaction causing severe rash involving mucus membranes or skin necrosis: Unknown Has patient had a PCN reaction that required hospitalization: No Has patient had a PCN reaction occurring within the last 10 years: No If all of the above answers are "NO", then may proceed with Cephalosporin use.     Medications Prior to Admission  Medication Sig Dispense Refill Last Dose  . acetaminophen (TYLENOL) 500 MG tablet Take 1,000 mg by mouth every 6 (six) hours as needed for mild pain or headache.   unk at prn  . aspirin 81 MG  chewable tablet Chew 81 mg by mouth daily.   05/13/2018 at Unknown time  . butalbital-acetaminophen-caffeine (FIORICET, ESGIC) 50-325-40 MG tablet Take 1 tablet by mouth 2 (two) times daily as needed for migraine.   unk at prn  . NOVOLIN N RELION 100 UNIT/ML injection Inject 12 Units into the skin at bedtime.   1 05/13/2018 at Unknown time  . Prenatal Vit-Fe Fumarate-FA (PRENATAL MULTIVITAMIN) TABS tablet Take 1 tablet by mouth daily at 12 noon.   05/13/2018 at Unknown time    Review of Systems  Constitutional: Negative for activity change, appetite change and fever.  Respiratory: Negative for chest tightness and shortness of breath.   Cardiovascular: Negative for chest pain.  Gastrointestinal: Positive for abdominal pain (mild cramping began today.).  Genitourinary: Positive for vaginal bleeding (small amount of bleeding with leaking of fluid this am.  loss of mucous plug this am).  Neurological: Negative for dizziness and headaches.   Physical Exam   Blood pressure (!) 147/91, pulse 85, temperature 98.6 F (37 C), temperature source Oral, resp. rate 17, height 5\' 8"  (1.727 m), weight 97.1 kg, SpO2 99 %.  Physical Exam  Nursing note and vitals reviewed. Constitutional: She is oriented to person, place, and time. She appears well-developed and well-nourished. No distress.  HENT:  Head: Normocephalic.  Neck: Normal range of motion.  Respiratory: Effort normal.  Genitourinary: There is no rash, tenderness or lesion on the right labia. There is no rash, tenderness or lesion on the left labia. Uterus is enlarged and tender. Cervix exhibits no motion tenderness, no discharge and no friability. There is bleeding (small amount of bright red bleeding without clot.  Neg for pooling) in the vagina.  Genitourinary Comments: Cervical exam by [place], RN  4cm, 60-70% effaced and -2.  Vertex.   Neurological: She is alert and oriented to person, place, and time.  Skin: Skin is warm and dry.  Psychiatric:  She has a normal mood and affect. Her behavior is normal. Thought content normal.   Vitals:   05/14/18 1549 05/14/18 1602 05/14/18 1617 05/14/18 1632  BP: (!) 138/94 137/90 132/89 (!) 147/91  Pulse: 88 87 87 85  Resp:      Temp:      TempSrc:      SpO2:      Weight:      Height:       Results for orders placed or performed during the hospital encounter of 05/14/18 (from the past 24 hour(s))  Fern Test     Status: None   Collection Time: 05/14/18  3:28 PM  Result Value Ref Range   POCT Fern Test Negative = intact amniotic membranes   Urinalysis, Routine w reflex microscopic     Status: Abnormal   Collection Time: 05/14/18  3:34 PM  Result Value Ref Range   Color, Urine YELLOW YELLOW   APPearance HAZY (A) CLEAR   Specific Gravity, Urine 1.014 1.005 - 1.030   pH 6.0 5.0 - 8.0   Glucose, UA 50 (A) NEGATIVE mg/dL   Hgb urine dipstick MODERATE (A) NEGATIVE   Bilirubin Urine NEGATIVE NEGATIVE   Ketones, ur NEGATIVE NEGATIVE mg/dL   Protein, ur NEGATIVE NEGATIVE mg/dL   Nitrite NEGATIVE NEGATIVE   Leukocytes, UA TRACE (A) NEGATIVE   RBC / HPF 0-5 0 - 5 RBC/hpf   WBC, UA 6-10 0 - 5 WBC/hpf   Bacteria, UA RARE (A) NONE SEEN   Squamous Epithelial / LPF 0-5 0 - 5   Mucus PRESENT   Protein / creatinine ratio, urine     Status: Abnormal   Collection Time: 05/14/18  3:34 PM  Result Value Ref Range   Creatinine, Urine 79.00 mg/dL   Total Protein, Urine 14 mg/dL   Protein Creatinine Ratio 0.18 (H) 0.00 - 0.15 mg/mg[Cre]  CBC with Differential/Platelet     Status: Abnormal   Collection Time: 05/14/18  3:47 PM  Result Value Ref Range   WBC 9.9 4.0 - 10.5 K/uL   RBC 4.05 3.87 - 5.11 MIL/uL   Hemoglobin 11.1 (L) 12.0 - 15.0 g/dL   HCT 16.1 (L) 09.6 - 04.5 %   MCV 83.0 78.0 - 100.0 fL   MCH 27.4 26.0 - 34.0 pg   MCHC 33.0 30.0 - 36.0 g/dL   RDW 40.9 81.1 - 91.4 %   Platelets 255 150 - 400 K/uL   Neutrophils Relative % 79 %   Neutro Abs 7.7 1.7 - 7.7 K/uL   Lymphocytes Relative 19 %    Lymphs Abs 1.9 0.7 - 4.0 K/uL   Monocytes Relative 2 %   Monocytes Absolute 0.2 0.1 - 1.0 K/uL   Eosinophils Relative 0 %   Eosinophils Absolute 0.0 0.0 - 0.7 K/uL   Basophils Relative 0 %   Basophils Absolute 0.0 0.0 - 0.1 K/uL  Comprehensive metabolic panel     Status: Abnormal  Collection Time: 05/14/18  3:47 PM  Result Value Ref Range   Sodium 133 (L) 135 - 145 mmol/L   Potassium 4.4 3.5 - 5.1 mmol/L   Chloride 105 98 - 111 mmol/L   CO2 18 (L) 22 - 32 mmol/L   Glucose, Bld 98 70 - 99 mg/dL   BUN 13 6 - 20 mg/dL   Creatinine, Ser 1.61 0.44 - 1.00 mg/dL   Calcium 9.1 8.9 - 09.6 mg/dL   Total Protein 5.9 (L) 6.5 - 8.1 g/dL   Albumin 2.7 (L) 3.5 - 5.0 g/dL   AST 16 15 - 41 U/L   ALT 14 0 - 44 U/L   Alkaline Phosphatase 93 38 - 126 U/L   Total Bilirubin <0.1 (L) 0.3 - 1.2 mg/dL   GFR calc non Af Amer >60 >60 mL/min   GFR calc Af Amer >60 >60 mL/min   Anion gap 10 5 - 15   MAU Course  Procedures  MDM MSE Exam FERN-Negative NST--  FHR baseline 130             Mod variability             [id number] accels              Uterine irritability. Occasional irregular contraction Consulted with Dr. Richardson Dopp at 15:22  HPI, MSE, VS, NST  And labs reported to Dr. Richardson Dopp.  Order given for [facility] labs and if negative, d/c to home.  Consulted with Dr. Richardson Dopp after labs back and reported BPs since we first talked.  Will admit for induction  Assessment and Plan  A;  Early labor       Chronic hypertension--elevated blood pressures today      Gestational diabetes   P:  Admit        .    Dennison Mascot Key 05/14/2018, 4:39 PM

## 2018-05-14 NOTE — Progress Notes (Signed)
   Subjective: Patient is comfortable with her epidural . AROM at 2038 clear fluid   Objective: BP 127/68   Pulse 82   Temp (!) 97.4 F (36.3 C) (Oral)   Resp 16   Ht 5\' 8"  (1.727 m)   Wt 97.1 kg   SpO2 100%   BMI 32.54 kg/m  No intake/output data recorded. No intake/output data recorded.  FHT:  FHR: 125 bpm, variability: moderate,  accelerations:  Abscent,  decelerations:  Present variable  UC:   regular, every 2 minutes SVE:   Dilation: 10 Effacement (%): 100 Station: Plus 1 Exam by:: Dr. Richardson Dopp  Labs: Lab Results  Component Value Date   WBC 9.9 05/14/2018   HGB 11.1 (L) 05/14/2018   HCT 33.6 (L) 05/14/2018   MCV 83.0 05/14/2018   PLT 255 05/14/2018    Assessment / Plan: Augmentation of labor, progressing well  Labor: Progressing normally Preeclampsia:  labs stable Fetal Wellbeing:  Category I and Category II Pain Control:  Epidural I/D:  n/a Anticipated MOD:  NSVD  Jaron Czarnecki J. 05/14/2018, 9:03 PM

## 2018-05-15 ENCOUNTER — Encounter (HOSPITAL_COMMUNITY): Payer: Self-pay

## 2018-05-15 LAB — CBC
HEMATOCRIT: 31 % — AB (ref 36.0–46.0)
HEMOGLOBIN: 10.2 g/dL — AB (ref 12.0–15.0)
MCH: 27.1 pg (ref 26.0–34.0)
MCHC: 32.9 g/dL (ref 30.0–36.0)
MCV: 82.4 fL (ref 78.0–100.0)
Platelets: 222 10*3/uL (ref 150–400)
RBC: 3.76 MIL/uL — AB (ref 3.87–5.11)
RDW: 13.4 % (ref 11.5–15.5)
WBC: 12.5 10*3/uL — ABNORMAL HIGH (ref 4.0–10.5)

## 2018-05-15 LAB — RPR: RPR Ser Ql: NONREACTIVE

## 2018-05-15 MED ORDER — SENNOSIDES-DOCUSATE SODIUM 8.6-50 MG PO TABS
2.0000 | ORAL_TABLET | ORAL | Status: DC
  Administered 2018-05-15 (×2): 2 via ORAL
  Filled 2018-05-15 (×2): qty 2

## 2018-05-15 MED ORDER — FERROUS SULFATE 325 (65 FE) MG PO TABS
325.0000 mg | ORAL_TABLET | Freq: Two times a day (BID) | ORAL | Status: DC
  Administered 2018-05-15 – 2018-05-16 (×3): 325 mg via ORAL
  Filled 2018-05-15 (×4): qty 1

## 2018-05-15 MED ORDER — COCONUT OIL OIL
1.0000 "application " | TOPICAL_OIL | Status: DC | PRN
  Administered 2018-05-15: 1 via TOPICAL
  Filled 2018-05-15: qty 120

## 2018-05-15 MED ORDER — ONDANSETRON HCL 4 MG PO TABS: 4.0000 mg | ORAL_TABLET | ORAL | Status: DC | PRN

## 2018-05-15 MED ORDER — NIFEDIPINE ER OSMOTIC RELEASE 30 MG PO TB24
30.0000 mg | ORAL_TABLET | Freq: Every day | ORAL | Status: DC
  Administered 2018-05-15: 30 mg via ORAL
  Filled 2018-05-15: qty 1

## 2018-05-15 MED ORDER — DIBUCAINE 1 % RE OINT: 1.0000 "application " | TOPICAL_OINTMENT | RECTAL | Status: DC | PRN

## 2018-05-15 MED ORDER — DIPHENHYDRAMINE HCL 25 MG PO CAPS: 25.0000 mg | ORAL_CAPSULE | Freq: Four times a day (QID) | ORAL | Status: DC | PRN

## 2018-05-15 MED ORDER — ONDANSETRON HCL 4 MG/2ML IJ SOLN: 4.0000 mg | INTRAMUSCULAR | Status: DC | PRN

## 2018-05-15 MED ORDER — BENZOCAINE-MENTHOL 20-0.5 % EX AERO
1.0000 "application " | INHALATION_SPRAY | CUTANEOUS | Status: DC | PRN
  Administered 2018-05-15: 1 via TOPICAL
  Filled 2018-05-15: qty 56

## 2018-05-15 MED ORDER — WITCH HAZEL-GLYCERIN EX PADS: 1.0000 "application " | MEDICATED_PAD | CUTANEOUS | Status: DC | PRN

## 2018-05-15 MED ORDER — OXYCODONE HCL 5 MG PO TABS: 5.0000 mg | ORAL_TABLET | ORAL | Status: DC | PRN

## 2018-05-15 MED ORDER — ACETAMINOPHEN 325 MG PO TABS: 650.0000 mg | ORAL_TABLET | ORAL | Status: DC | PRN

## 2018-05-15 MED ORDER — PRENATAL MULTIVITAMIN CH
1.0000 | ORAL_TABLET | Freq: Every day | ORAL | Status: DC
  Administered 2018-05-15 – 2018-05-16 (×2): 1 via ORAL
  Filled 2018-05-15 (×2): qty 1

## 2018-05-15 MED ORDER — OXYCODONE HCL 5 MG PO TABS: 10.0000 mg | ORAL_TABLET | ORAL | Status: DC | PRN

## 2018-05-15 MED ORDER — SIMETHICONE 80 MG PO CHEW: 80.0000 mg | CHEWABLE_TABLET | ORAL | Status: DC | PRN

## 2018-05-15 MED ORDER — NIFEDIPINE ER OSMOTIC RELEASE 30 MG PO TB24
30.0000 mg | ORAL_TABLET | Freq: Every day | ORAL | Status: DC
  Administered 2018-05-15 – 2018-05-16 (×2): 30 mg via ORAL
  Filled 2018-05-15 (×2): qty 1

## 2018-05-15 MED ORDER — ZOLPIDEM TARTRATE 5 MG PO TABS: 5.0000 mg | ORAL_TABLET | Freq: Every evening | ORAL | Status: DC | PRN

## 2018-05-15 MED ORDER — IBUPROFEN 600 MG PO TABS
600.0000 mg | ORAL_TABLET | Freq: Four times a day (QID) | ORAL | Status: DC
  Administered 2018-05-15 – 2018-05-16 (×7): 600 mg via ORAL
  Filled 2018-05-15 (×7): qty 1

## 2018-05-15 NOTE — Progress Notes (Signed)
Postpartum day #1, NSVD  Subjective Pt without complaints.  Lochia normal.  Pain controlled.  Breast feeding yes  Temp:  [97.4 F (36.3 C)-98.6 F (37 C)] 98.2 F (36.8 C) (09/10 0510) Pulse Rate:  [70-135] 70 (09/10 0510) Resp:  [16-22] 16 (09/10 0510) BP: (126-162)/(66-103) 147/96 (09/10 0510) SpO2:  [98 %-100 %] 99 % (09/10 0510) Weight:  [97.1 kg] 97.1 kg (09/09 1424)  Gen:  NAD, A&O x 3 Uterine fundus:  Firm, nontender Abd:  No upper abdominal pain Lochia normal Ext:  +Edema, no calf tenderness bilaterally Neuro: 2+DTR  CBC    Component Value Date/Time   WBC 12.5 (H) 05/15/2018 0548   RBC 3.76 (L) 05/15/2018 0548   HGB 10.2 (L) 05/15/2018 0548   HCT 31.0 (L) 05/15/2018 0548   PLT 222 05/15/2018 0548   MCV 82.4 05/15/2018 0548   MCH 27.1 05/15/2018 0548   MCHC 32.9 05/15/2018 0548   RDW 13.4 05/15/2018 0548   LYMPHSABS 1.9 05/14/2018 1547   MONOABS 0.2 05/14/2018 1547   EOSABS 0.0 05/14/2018 1547   BASOSABS 0.0 05/14/2018 1547   CMP     Component Value Date/Time   NA 133 (L) 05/14/2018 1547   K 4.4 05/14/2018 1547   CL 105 05/14/2018 1547   CO2 18 (L) 05/14/2018 1547   GLUCOSE 98 05/14/2018 1547   BUN 13 05/14/2018 1547   CREATININE 0.44 05/14/2018 1547   CALCIUM 9.1 05/14/2018 1547   PROT 5.9 (L) 05/14/2018 1547   ALBUMIN 2.7 (L) 05/14/2018 1547   AST 16 05/14/2018 1547   ALT 14 05/14/2018 1547   ALKPHOS 93 05/14/2018 1547   BILITOT <0.1 (L) 05/14/2018 1547   GFRNONAA >60 05/14/2018 1547   GFRAA >60 05/14/2018 1547    A/P: S/p SVD doing well. CHTN - Elevated BP postpartum.  Procardia started overnight.  BP mildly elevated currently.  No s/sxs of Preeclampsia. Routine postpartum care. Lactation support. Discharge 1-2 days.  Geryl Rankins 05/15/2018, 8:31 AM

## 2018-05-15 NOTE — Lactation Note (Addendum)
This note was copied from a baby's chart. Lactation Consultation Note  Patient Name: Kendra Stewart Date: 05/15/2018 Reason for consult: Initial assessment;Early term 37-38.6wks;1st time breastfeeding;Breast augmentation P1, 5 hour female infant, ETI and mom had breast reduction (augmentation), Mom hx CHTN Mom taught hand expression by [facility] and expressed 0.5 ml that was spoon feed to infant. Infant latched on mom's left breast in cross cradle position, wide gape and few swallowing observed with stimulation. Infant had been BF for  8 mins as LC left room , mom was still feeding infant at breast. Discussed with parents infant behaviors and feeding guidelines for ETI, not exceed 3 hours without BF, and do not exceed 30 minutes with feeding due ETI and possible supplementation of EBM / or formula if needed.  LC discussed mom will use DEBP for breast stimulation and induction due infant being ETI and mom having breast reduction. Mom knows to pump q3h for 15-20 min. Mom shown how to use DEBP & how to disassemble, clean, & reassemble parts. Mom will give infant  back any EBM after BF. Mom encouraged to feed baby 8-12 times/24 hours and with feeding cues.  LC discussed I& O. Mom made aware of O/P services, breastfeeding support groups, community resources, and our phone # for post-discharge questions.     Maternal Data Formula Feeding for Exclusion: No Has patient been taught Hand Expression?: Yes(Mom expressed 0.5 ml of colostrum on spoon) Does the patient have breastfeeding experience prior to this delivery?: No  Feeding Feeding Type: Breast Fed Length of feed: 8 min(Mom still BF as LC left room.)  LATCH Score Latch: Grasps breast easily, tongue down, lips flanged, rhythmical sucking.  Audible Swallowing: A few with stimulation  Type of Nipple: Everted at rest and after stimulation  Comfort (Breast/Nipple): Soft / non-tender  Hold (Positioning): Assistance needed to correctly  position infant at breast and maintain latch.  LATCH Score: 8  Interventions Interventions: Breast feeding basics reviewed;Assisted with latch;Support pillows;Position options;Hand express;Breast compression;DEBP;Adjust position  Lactation Tools Discussed/Used WIC Program: No Pump Review: Setup, frequency, and cleaning;Milk Storage Initiated by:: Danelle Earthly, IBCLC Date initiated:: 05/15/18   Consult Status Consult Status: Follow-up Date: 05/15/18 Follow-up type: In-patient    Danelle Earthly 05/15/2018, 2:43 AM

## 2018-05-15 NOTE — Lactation Note (Signed)
This note was copied from a baby's chart. Lactation Consultation Note  Patient Name: Girl Elyanah Nicolson [id number] Date: 05/15/2018 Reason for consult: Follow-up assessment;1st time breastfeeding;Early term 37-38.6wks  P1 mother whose infant is now 81 hours old  Mother had no questions/concerns when I visiied.  She feels like breast feeding is going well and states that baby is still sleepy.  I reassured her this is typical behavior for a baby at this age.  Her voids/stools are adequate.    Encouraged to continue feeding 8-12 times/24 hours or sooner if baby shows cues.  Continue hand expression before/after feedings and feed back any EBM obtained with hand expression.  Also encouraged mother to have an RN/LC observe latching once per shift.  Mother verbalized that an RN observed earlier today and felt the latch was effective.  Mother will call for help as needed.  Family in room visiting.   Maternal Data Formula Feeding for Exclusion: No Has patient been taught Hand Expression?: Yes Does the patient have breastfeeding experience prior to this delivery?: No  Feeding Feeding Type: Breast Fed Length of feed: 25 min  LATCH Score                   Interventions    Lactation Tools Discussed/Used     Consult Status Consult Status: Follow-up Date: 05/16/18 Follow-up type: In-patient    Ameriah Lint R Gearl Baratta 05/15/2018, 4:30 PM

## 2018-05-15 NOTE — Progress Notes (Signed)
MOB was referred for history of depression/anxiety. * Referral screened out by Clinical Social Worker because none of the following criteria appear to apply: ~ History of anxiety/depression during this pregnancy, or of post-partum depression following prior delivery. ~ Diagnosis of anxiety and/or depression within last 3 years OR * MOB's symptoms currently being treated with medication and/or therapy. Per OB records MOB has a Rx for Wellbutrin.  Please contact the Clinical Social Worker if needs arise, by [facility] request, or if MOB scores greater than 9/yes to question 10 on [place] Postpartum Depression Screen.  Blaine Hamper, MSW, LCSW Clinical Social Work [phone]

## 2018-05-16 ENCOUNTER — Other Ambulatory Visit: Payer: Self-pay

## 2018-05-16 MED ORDER — NIFEDIPINE ER 30 MG PO TB24: 30.0000 mg | ORAL_TABLET | Freq: Every day | ORAL | 1 refills | Status: DC

## 2018-05-16 MED ORDER — IBUPROFEN 600 MG PO TABS: 600.0000 mg | ORAL_TABLET | Freq: Four times a day (QID) | ORAL | 0 refills | Status: DC

## 2018-05-16 NOTE — Discharge Instructions (Signed)
Postpartum Care After Vaginal Delivery °The period of time right after you deliver your newborn is called the postpartum period. °What kind of medical care will I receive? °· You may continue to receive fluids and medicines through an IV tube inserted into one of your veins. °· If an incision was made near your vagina (episiotomy) or if you had some vaginal tearing during delivery, cold compresses may be placed on your episiotomy or your tear. This helps to reduce pain and swelling. °· You may be given a squirt bottle to use when you go to the bathroom. You may use this until you are comfortable wiping as usual. To use the squirt bottle, follow these steps: °? Before you urinate, fill the squirt bottle with warm water. Do not use hot water. °? After you urinate, while you are sitting on the toilet, use the squirt bottle to rinse the area around your urethra and vaginal opening. This rinses away any urine and blood. °? You may do this instead of wiping. As you start healing, you may use the squirt bottle before wiping yourself. Make sure to wipe gently. °? Fill the squirt bottle with clean water every time you use the bathroom. °· You will be given sanitary pads to wear. °How can I expect to feel? °· You may not feel the need to urinate for several hours after delivery. °· You will have some soreness and pain in your abdomen and vagina. °· If you are breastfeeding, you may have uterine contractions every time you breastfeed for up to several weeks postpartum. Uterine contractions help your uterus return to its normal size. °· It is normal to have vaginal bleeding (lochia) after delivery. The amount and appearance of lochia is often similar to a menstrual period in the first week after delivery. It will gradually decrease over the next few weeks to a dry, yellow-brown discharge. For most women, lochia stops completely by 6-8 weeks after delivery. Vaginal bleeding can vary from woman to woman. °· Within the first few  days after delivery, you may have breast engorgement. This is when your breasts feel heavy, full, and uncomfortable. Your breasts may also throb and feel hard, tightly stretched, warm, and tender. After this occurs, you may have milk leaking from your breasts. Your health care provider can help you relieve discomfort due to breast engorgement. Breast engorgement should go away within a few days. °· You may feel more sad or worried than normal due to hormonal changes after delivery. These feelings should not last more than a few days. If these feelings do not go away after several days, speak with your health care provider. °How should I care for myself? °· Tell your health care provider if you have pain or discomfort. °· Drink enough water to keep your urine clear or pale yellow. °· Wash your hands thoroughly with soap and water for at least 20 seconds after changing your sanitary pads, after using the toilet, and before holding or feeding your baby. °· If you are not breastfeeding, avoid touching your breasts a lot. Doing this can make your breasts produce more milk. °· If you become weak or lightheaded, or you feel like you might faint, ask for help before: °? Getting out of bed. °? Showering. °· Change your sanitary pads frequently. Watch for any changes in your flow, such as a sudden increase in volume, a change in color, the passing of large blood clots. If you pass a blood clot from your vagina, save it   to show to your health care provider. Do not flush blood clots down the toilet without having your health care provider look at them. °· Make sure that all your vaccinations are up to date. This can help protect you and your baby from getting certain diseases. You may need to have immunizations done before you leave the hospital. °· If desired, talk with your health care provider about methods of family planning or birth control (contraception). °How can I start bonding with my baby? °Spending as much time as  possible with your baby is very important. During this time, you and your baby can get to know each other and develop a bond. Having your baby stay with you in your room (rooming in) can give you time to get to know your baby. Rooming in can also help you become comfortable caring for your baby. Breastfeeding can also help you bond with your baby. °How can I plan for returning home with my baby? °· Make sure that you have a car seat installed in your vehicle. °? Your car seat should be checked by a certified car seat installer to make sure that it is installed safely. °? Make sure that your baby fits into the car seat safely. °· Ask your health care provider any questions you have about caring for yourself or your baby. Make sure that you are able to contact your health care provider with any questions after leaving the hospital. °This information is not intended to replace advice given to you by your health care provider. Make sure you discuss any questions you have with your health care provider. °Document Released: 06/19/2007 Document Revised: 01/25/2016 Document Reviewed: 07/27/2015 °Elsevier Interactive Patient Education © 2018 [employer] Inc[employer] ° ° °Preeclampsia and Eclampsia °Preeclampsia is a serious condition that develops only during pregnancy. It is also called toxemia of pregnancy. This condition causes high blood pressure along with other symptoms, such as swelling and headaches. These symptoms may develop as the condition gets worse. Preeclampsia may occur at 20 weeks of pregnancy or later. °Diagnosing and treating preeclampsia early is very important. If not treated early, it can cause serious problems for you and your baby. One problem it can lead to is eclampsia, which is a condition that causes muscle jerking or shaking (convulsions or seizures) in the mother. Delivering your baby is the best treatment for preeclampsia or eclampsia. Preeclampsia and eclampsia symptoms usually go away after your baby is  born. °What are the causes? °The cause of preeclampsia is not known. °What increases the risk? °The following risk factors make you more likely to develop preeclampsia: °· Being pregnant for the first time. °· Having had preeclampsia during a past pregnancy. °· Having a family history of preeclampsia. °· Having high blood pressure. °· Being pregnant with twins or triplets. °· Being 35 or older. °· Being African-American. °· Having kidney disease or diabetes. °· Having medical conditions such as lupus or blood diseases. °· Being very overweight (obese). ° °What are the signs or symptoms? °The earliest signs of preeclampsia are: °· High blood pressure. °· Increased protein in your urine. Your health care provider will check for this at every visit before you give birth (prenatal visit). ° °Other symptoms that may develop as the condition gets worse include: °· Severe headaches. °· Sudden weight gain. °· Swelling of the hands, face, legs, and feet. °· Nausea and vomiting. °· Vision problems, such as blurred or double vision. °· Numbness in the face, arms, legs, and feet. °·   feet.  Urinating less than usual.  Dizziness.  Slurred speech.  Abdominal pain, especially upper abdominal pain.  Convulsions or seizures.  Symptoms generally go away after giving birth. How is this diagnosed? There are no screening tests for preeclampsia. Your health care provider will ask you about symptoms and check for signs of preeclampsia during your prenatal visits. You may also have tests that include:  Urine tests.  Blood tests.  Checking your blood pressure.  Monitoring your babys heart rate.  Ultrasound.  How is this treated? You and your health care provider will determine the treatment approach that is best for you. Treatment may include:  Having more frequent prenatal exams to check for signs of preeclampsia, if you have an increased risk for preeclampsia.  Bed rest.  Reducing how much salt (sodium) you  eat.  Medicine to lower your blood pressure.  Staying in the hospital, if your condition is severe. There, treatment will focus on controlling your blood pressure and the amount of fluids in your body (fluid retention).  You may need to take medicine (magnesium sulfate) to prevent seizures. This medicine may be given as an injection or through an IV tube.  Delivering your baby early, if your condition gets worse. You may have your labor started with medicine (induced), or you may have a cesarean delivery.  Follow these instructions at home: Eating and drinking   Drink enough fluid to keep your urine clear or pale yellow.  Eat a healthy diet that is low in sodium. Do not add salt to your food. Check nutrition labels to see how much sodium a food or beverage contains.  Avoid caffeine. Lifestyle  Do not use any products that contain nicotine or tobacco, such as cigarettes and e-cigarettes. If you need help quitting, ask your health care provider.  Do not use alcohol or drugs.  Avoid stress as much as possible. Rest and get plenty of sleep. General instructions  Take over-the-counter and prescription medicines only as told by your health care provider.  When lying down, lie on your side. This keeps pressure off of your baby.  When sitting or lying down, raise (elevate) your feet. Try putting some pillows underneath your lower legs.  Exercise regularly. Ask your health care provider what kinds of exercise are best for you.  Keep all follow-up and prenatal visits as told by your health care provider. This is important. How is this prevented? To prevent preeclampsia or eclampsia from developing during another pregnancy:  Get proper medical care during pregnancy. Your health care provider may be able to prevent preeclampsia or diagnose and treat it early.  Your health care provider may have you take a low-dose aspirin or a calcium supplement during your next pregnancy.  You may  have tests of your blood pressure and kidney function after giving birth.  Maintain a healthy weight. Ask your health care provider for help managing weight gain during pregnancy.  Work with your health care provider to manage any long-term (chronic) health conditions you have, such as diabetes or kidney problems.  Contact a health care provider if:  You gain more weight than expected.  You have headaches.  You have nausea or vomiting.  You have abdominal pain.  You feel dizzy or light-headed. Get help right away if:  You develop sudden or severe swelling anywhere in your body. This usually happens in the legs.  You gain 5 lbs (2.3 kg) or more during one week.  You have severe: ? Abdominal  Headaches. °? Dizziness. °? Vision problems. °? Confusion. °? Nausea or vomiting. °· You have a seizure. °· You have trouble moving any part of your body. °· You develop numbness in any part of your body. °· You have trouble speaking. °· You have any abnormal bleeding. °· You pass out. °This information is not intended to replace advice given to you by your health care provider. Make sure you discuss any questions you have with your health care provider. °Document Released: 08/19/2000 Document Revised: 04/19/2016 Document Reviewed: 03/28/2016 °Elsevier Interactive Patient Education © 2018 Elsevier Inc. ° °

## 2018-05-16 NOTE — Discharge Summary (Signed)
OB Discharge Summary     Patient Name: Kendra Stewart DOB: 1981/04/15 MRN: [id number]  Date of admission: 05/14/2018 Delivering MD: Gerald Leitz   Date of discharge: 05/16/2018  Admitting diagnosis: 37wks labor lost plug Intrauterine pregnancy: [redacted]w[redacted]d     Secondary diagnosis:  Active Problems:   Chronic hypertension affecting pregnancy  Additional problems: GDM A2 (Insulin)     Discharge diagnosis: Early term pregnancy delivered                                                                                                Post partum procedures:None  Augmentation: AROM and Pitocin  Complications: None  Hospital course:  Onset of Labor With Vaginal Delivery     37 y.o. yo G1P1001 at [redacted]w[redacted]d was admitted in Latent Labor on 05/14/2018. Patient had an uncomplicated labor course as follows:  Membrane Rupture Time/Date: 8:38 PM ,05/14/2018   Intrapartum Procedures: Episiotomy:                                           Lacerations:  2nd degree [3];Perineal [11]  Patient had a delivery of a Viable infant. 05/14/2018  Information for the patient's newborn:  Tommie Ard [[id number]]  Delivery Method: Vaginal, Spontaneous(Filed from Delivery Summary)    Pateint had an uncomplicated postpartum course.  She is ambulating, tolerating a regular diet, passing flatus, and urinating well. Patient is discharged home in stable condition on 05/24/18.   Physical exam  Vitals:   05/15/18 2150 05/16/18 0520 05/16/18 1047 05/16/18 1420  BP:  (!) 140/94 103/60 131/84  Pulse: 89 84 85 85  Resp: 18 16    Temp: 98.1 F (36.7 C) 99.1 F (37.3 C)    TempSrc: Oral Oral    SpO2: 99% 99%    Weight:      Height:       General: alert, cooperative and no distress Lochia: appropriate Uterine Fundus: firm Incision: N/A DVT Evaluation: No evidence of DVT seen on physical exam. Calf/Ankle edema is present Labs: Lab Results  Component Value Date   WBC 12.5 (H) 05/15/2018   HGB 10.2 (L) 05/15/2018   HCT  31.0 (L) 05/15/2018   MCV 82.4 05/15/2018   PLT 222 05/15/2018   CMP Latest Ref Rng & Units 05/14/2018  Glucose 70 - 99 mg/dL 98  BUN 6 - 20 mg/dL 13  Creatinine 7.82 - 9.56 mg/dL 2.13  Sodium 086 - 578 mmol/L 133(L)  Potassium 3.5 - 5.1 mmol/L 4.4  Chloride 98 - 111 mmol/L 105  CO2 22 - 32 mmol/L 18(L)  Calcium 8.9 - 10.3 mg/dL 9.1  Total Protein 6.5 - 8.1 g/dL 5.9(L)  Total Bilirubin 0.3 - 1.2 mg/dL <4.6(N)  Alkaline Phos 38 - 126 U/L 93  AST 15 - 41 U/L 16  ALT 0 - 44 U/L 14    Discharge instruction: per After Visit Summary and "Baby and Me Booklet".  After visit meds:  Allergies as of 05/16/2018  Reactions   Penicillins Hives   Has patient had a PCN reaction causing immediate rash, facial/tongue/throat swelling, SOB or lightheadedness with hypotension: Unknown Has patient had a PCN reaction causing severe rash involving mucus membranes or skin necrosis: Unknown Has patient had a PCN reaction that required hospitalization: No Has patient had a PCN reaction occurring within the last 10 years: No If Stewart of the above answers are "NO", then may proceed with Cephalosporin use.      Medication List    TAKE these medications   acetaminophen 500 MG tablet Commonly known as:  TYLENOL Take 1,000 mg by mouth every 6 (six) hours as needed for mild pain or headache.   aspirin 81 MG chewable tablet Chew 81 mg by mouth daily.   butalbital-acetaminophen-caffeine 50-325-40 MG tablet Commonly known as:  FIORICET, ESGIC Take 1 tablet by mouth 2 (two) times daily as needed for migraine.   ibuprofen 600 MG tablet Commonly known as:  ADVIL,MOTRIN Take 1 tablet (600 mg total) by mouth every 6 (six) hours.   NIFEdipine 30 MG 24 hr tablet Commonly known as:  PROCARDIA-XL/ADALAT CC Take 1 tablet (30 mg total) by mouth daily. Start taking on:  05/17/2018   NOVOLIN N RELION 100 UNIT/ML injection Generic drug:  insulin NPH Human Inject 12 Units into the skin at bedtime.   prenatal  multivitamin Tabs tablet Take 1 tablet by mouth daily at 12 noon.       Diet: routine diet  Activity: Advance as tolerated. Pelvic rest for 6 weeks.   Outpatient follow up:1 week BP check Follow up Appt:No future appointments. Follow up Visit:No follow-ups on file.  Postpartum contraception: Discussed  Newborn Data: Live born female  Birth Weight: 7 lb 3.2 oz (3265 g) APGAR: 9, 9  Newborn Delivery   Birth date/time:  05/14/2018 21:33:00 Delivery type:  Vaginal, Spontaneous     Baby Feeding: Breast Disposition:home with mother   05/16/2018 Geryl Rankins, MD

## 2018-05-16 NOTE — Lactation Note (Signed)
This note was copied from a baby's chart. Lactation Consultation Note  Patient Name: Kendra Stewart EQAST'M Date: 05/16/2018 Reason for consult: Follow-up assessment;1st time breastfeeding;Primapara;Early term 37-38.6wks;Infant weight loss;Breast reduction  Went to mom's room twice, the first time baby was napping and mom asked LC to come back later for latch assessment, discussed cluster feeding, baby sleeping cycle, tips for a deep latch and position options. LC came at 5 pm for latch assessment, checked baby's diaper and asked mom to hand express, prior latching, colostrum was seen. LC took her STS to mom's right breast in football position and she was able to latch almost right away, with a big wide open mouth, LC showed mom how to flange the lips and gently pull the chin for even a deeper latch. A few swallows were noted, but baby soon started to lose the depth and fell asleep at the 7' mark.  Reviewed discharge instructions with mom, and signs on when to call your baby's pediatrician. Engorgement prevention and treatment was discussed as well as prevention for sore nipples. Mom is aware of LC OP services and will contact PRN.  Maternal Data    Feeding Feeding Type: Breast Fed Length of feed: 7 min  LATCH Score Latch: Grasps breast easily, tongue down, lips flanged, rhythmical sucking.  Audible Swallowing: A few with stimulation  Type of Nipple: Everted at rest and after stimulation  Comfort (Breast/Nipple): Soft / non-tender  Hold (Positioning): Assistance needed to correctly position infant at breast and maintain latch.  LATCH Score: 8  Interventions Interventions: Breast feeding basics reviewed;Assisted with latch;Skin to skin;Breast massage;Hand express;Breast compression;Adjust position;Position options;Support pillows  Lactation Tools Discussed/Used     Consult Status Consult Status: Complete Date: 05/16/18 Follow-up type: Call as needed    [place] Venetia Constable 05/16/2018, 5:26 PM

## 2018-05-17 DIAGNOSIS — Z00110 Health examination for newborn under 8 days old: Secondary | ICD-10-CM | POA: Diagnosis not present

## 2018-05-17 NOTE — Anesthesia Postprocedure Evaluation (Signed)
Anesthesia Post Note  Patient: Charlane FerrettiSandra Ceballos  Procedure(s) Performed: AN AD HOC LABOR EPIDURAL     Patient location during evaluation: Mother Baby Anesthesia Type: Epidural Level of consciousness: awake and alert Pain management: pain level controlled Vital Signs Assessment: post-procedure vital signs reviewed and stable Respiratory status: spontaneous breathing, nonlabored ventilation and respiratory function stable Cardiovascular status: stable Postop Assessment: no headache, no backache and epidural receding Anesthetic complications: no    Last Vitals: There were no vitals filed for this visit.  Last Pain: There were no vitals filed for this visit.               Kwesi Sangha L Chablis Losh

## 2018-05-23 DIAGNOSIS — O169 Unspecified maternal hypertension, unspecified trimester: Secondary | ICD-10-CM | POA: Diagnosis not present

## 2018-07-11 DIAGNOSIS — L658 Other specified nonscarring hair loss: Secondary | ICD-10-CM | POA: Diagnosis not present

## 2018-07-11 DIAGNOSIS — L83 Acanthosis nigricans: Secondary | ICD-10-CM | POA: Diagnosis not present

## 2018-07-11 DIAGNOSIS — L858 Other specified epidermal thickening: Secondary | ICD-10-CM | POA: Diagnosis not present

## 2018-07-11 DIAGNOSIS — D225 Melanocytic nevi of trunk: Secondary | ICD-10-CM | POA: Diagnosis not present

## 2018-07-20 DIAGNOSIS — Z23 Encounter for immunization: Secondary | ICD-10-CM | POA: Diagnosis not present

## 2019-07-29 ENCOUNTER — Other Ambulatory Visit: Payer: Self-pay

## 2019-07-29 ENCOUNTER — Ambulatory Visit: Payer: BC Managed Care – PPO | Admitting: Adult Health

## 2019-07-29 ENCOUNTER — Encounter: Payer: Self-pay | Admitting: Adult Health

## 2019-07-29 VITALS — BP 132/102 | HR 96 | Temp 96.8°F | Resp 16 | Ht 66.5 in | Wt 255.8 lb

## 2019-07-29 DIAGNOSIS — R635 Abnormal weight gain: Secondary | ICD-10-CM | POA: Diagnosis not present

## 2019-07-29 DIAGNOSIS — R82998 Other abnormal findings in urine: Secondary | ICD-10-CM | POA: Diagnosis not present

## 2019-07-29 DIAGNOSIS — I83812 Varicose veins of left lower extremities with pain: Secondary | ICD-10-CM

## 2019-07-29 DIAGNOSIS — Z0000 Encounter for general adult medical examination without abnormal findings: Secondary | ICD-10-CM

## 2019-07-29 DIAGNOSIS — S39012A Strain of muscle, fascia and tendon of lower back, initial encounter: Secondary | ICD-10-CM

## 2019-07-29 DIAGNOSIS — I10 Essential (primary) hypertension: Secondary | ICD-10-CM

## 2019-07-29 LAB — POCT URINALYSIS DIPSTICK
Bilirubin, UA: NEGATIVE
Glucose, UA: NEGATIVE
Ketones, UA: NEGATIVE
Nitrite, UA: NEGATIVE
Protein, UA: POSITIVE — AB
Spec Grav, UA: 1.01 (ref 1.010–1.025)
Urobilinogen, UA: 1 E.U./dL
pH, UA: 7 (ref 5.0–8.0)

## 2019-07-29 MED ORDER — BACLOFEN 10 MG PO TABS: 10.0000 mg | ORAL_TABLET | Freq: Every evening | ORAL | 0 refills | Status: DC | PRN

## 2019-07-29 MED ORDER — LOSARTAN POTASSIUM-HCTZ 50-12.5 MG PO TABS: 1.0000 | ORAL_TABLET | Freq: Every day | ORAL | 1 refills | Status: DC

## 2019-07-29 NOTE — Progress Notes (Signed)
Patient: Kendra Stewart, Female    DOB: 14-Dec-1980, 38 y.o.   MRN: [id number] Visit Date: 07/29/2019  Today's Provider: Jairo Ben, FNP   Chief Complaint  Patient presents with  . New Patient (Initial Visit)   Subjective:    New Patient Kendra Stewart is a 38 y.o. female who presents today for health maintenance and establish care, patient reports that her previous PCP was at [facility]. She feels well. She reports exercising by walking daily . She reports she is sleeping fairly well, on average sleeping 7hrs a night.  ----------------------------------------------------------------- She reports that she was last seen by her primary care around 2 years ago she saw [facility] in [place].  She gave vaginal birth. G2P1.  History of termination at 38 years old.  Girl just turns a year ago. - She sees Ecologist reports that she is up-to-date on her breast exam and gynecological/Pap smear. He also has a history of breast reduction surgery she had a nodule removed that was not cancerous at time of her reduction. 2005   Hypertension: she take Norvasc 10 mg since her pregnancy. She had been on procardia before Norvasc. It seemed to help at the time. She lost her insurance, came off the medications and restated around two months ago. Mild swelling in her hands and feet for a couple weeks since restarting the mediations.   Migraines occasionally. Has no aura. No new or changing or new headaches.  No numbness or paresthesias.  She wants to be considered for bariatric surgery, She started getting information from [facility] and is filling out the paperwork.  She has had medically supervised weight loss, tried phentermine twice, and other fad diets over multiple years with results but she always fails to keep the weight off.  She reports her pregnancy last year threw her over the edge with her weight, and she feels like she has not been able to get it back under control.   Weight over 200lbs for over 3 years. She feels uncomfortable. She tries to work from home , she tries to keep actiive by walking. She had reported normal ekg with phentermine. She  year tries to eat a healthy diet she has issues with portion control She reports BMI over 35 for years. This is her highest BMI today.  Body mass index is 40.67 kg/m. Filed Weights   07/29/19 0918  Weight: 255 lb 12.8 oz (116 kg)    Denies any cardiac, or pulmonary disease.  Denies any congesital disease.  She does not smoke and denies ever smoking or drink alcohol.  Denies any drug use.  She takes norethindrone/ethinyl estradiol oral contraceptives.  Takes regularly.   Mild pain in right leg history of varicose veins, with pregnancy.   LMP- started today 07/29/19.  Ports her menstrual cycles are normal. No urinary symptoms.  She has an area on her right lower leg that she was recently told was a large varicose vein, it does come and go.  Has been present for around 3 years.  She does have mild swelling in her bilateral feet, though she is on a calcium channel blocker currently.  Does have pain in her legs both of them when standing or walking prolonged.  She has a history of back pain over the last 3 to 4 months she reports that it is usually relieved by heating pad and ibuprofen, she describes it as bilateral tightness in her lower back.  She denies paresthesias or  radiculopathy.  She denies any saddle paresthesia.  She denies any loss of bowel or bladder control.  Denies sciatica.  Review of Systems  Constitutional: Positive for fatigue. Negative for activity change, appetite change, chills, diaphoresis, fever and unexpected weight change.  HENT: Negative.   Respiratory: Negative.   Cardiovascular: Positive for leg swelling (Reports mild edema in her lower extremities worse in the evenings after she has been on her feet.). Negative for chest pain and palpitations.  Gastrointestinal: Negative.   Genitourinary:  Negative.   Musculoskeletal: Positive for back pain (Does have a 54-year-old that she picks up often.) and myalgias. Negative for arthralgias, gait problem, joint swelling, neck pain and neck stiffness.  Skin: Negative.        Denies any concerns with her skin, she does see dermatology and has had a few moles removed in the past but was told that they were normal.  Has any new or changing skin lesions.  She denies any rash.  Allergic/Immunologic: Negative for environmental allergies, food allergies and immunocompromised state.        -- Penicillins -- Hives   --  Has patient had a PCN reaction causing immediate            rash, facial/tongue/throat swelling, SOB or            lightheadedness with hypotension: UnknownHas            patient had a PCN reaction causing severe rash            involving mucus membranes or skin necrosis:            UnknownHas patient had a PCN reaction that            required hospitalization: NoHas patient had a PCN            reaction occurring within the last 10 years: NoIf            all of the above answers are "NO", then may            proceed with Cephalosporin use.   Neurological: Positive for headaches. Negative for dizziness, tremors, seizures, syncope, facial asymmetry, speech difficulty, weakness, light-headedness and numbness.  Hematological: Negative.   Psychiatric/Behavioral: Negative for agitation, behavioral problems, confusion, decreased concentration, dysphoric mood, hallucinations, self-injury, sleep disturbance and suicidal ideas. The patient is nervous/anxious. The patient is not hyperactive.   All other systems reviewed and are negative.   Social History She  reports that she has never smoked. She has never used smokeless tobacco. She reports previous alcohol use. She reports that she does not use drugs. Social History   Socioeconomic History  . Marital status: Single    Spouse name: Not on file  . Number of children: Not on file  .  Years of education: Not on file  . Highest education level: Not on file  Occupational History  . Not on file  Social Needs  . Financial resource strain: Not on file  . Food insecurity    Worry: Not on file    Inability: Not on file  . Transportation needs    Medical: Not on file    Non-medical: Not on file  Tobacco Use  . Smoking status: Never Smoker  . Smokeless tobacco: Never Used  Substance and Sexual Activity  . Alcohol use: Not Currently    Frequency: Never  . Drug use: Never  . Sexual activity: Yes  Lifestyle  . Physical activity  Days per week: Not on file    Minutes per session: Not on file  . Stress: Not on file  Relationships  . Social Musicianconnections    Talks on phone: Not on file    Gets together: Not on file    Attends religious service: Not on file    Active member of club or organization: Not on file    Attends meetings of clubs or organizations: Not on file    Relationship status: Not on file  Other Topics Concern  . Not on file  Social History Narrative  . Not on file    Patient Active Problem List   Diagnosis Date Noted  . Chronic hypertension affecting pregnancy 05/14/2018    Past Surgical History:  Procedure Laterality Date  . BREAST REDUCTION SURGERY    . BREAST SURGERY    . TONSILLECTOMY      Family History  Family Status  Relation Name Status  . Mother  Alive  . Father  Alive   Her family history includes Anxiety disorder in her mother; Depression in her mother; Hyperlipidemia in her father and mother; Hypertension in her father and mother.     Allergies  Allergen Reactions  . Penicillins Hives    Has patient had a PCN reaction causing immediate rash, facial/tongue/throat swelling, SOB or lightheadedness with hypotension: Unknown Has patient had a PCN reaction causing severe rash involving mucus membranes or skin necrosis: Unknown Has patient had a PCN reaction that required hospitalization: No Has patient had a PCN reaction  occurring within the last 10 years: No If all of the above answers are "NO", then may proceed with Cephalosporin use.     Previous Medications   AMLODIPINE (NORVASC) 10 MG TABLET       BUTALBITAL-ACETAMINOPHEN-CAFFEINE (FIORICET, ESGIC) 50-325-40 MG TABLET    Take 1 tablet by mouth 2 (two) times daily as needed for migraine.    Patient Care Team: Berniece PapFlinchum, Ninoshka Wainwright S, FNP as PCP - General (Family Medicine)      Objective:   Vitals: BP (!) 132/102   Pulse 96   Temp (!) 96.8 F (36 C) (Oral)   Resp 16   Ht 5' 6.5" (1.689 m)   Wt 255 lb 12.8 oz (116 kg)   LMP 07/29/2019 (Exact Date)   SpO2 97%   Breastfeeding No   BMI 40.67 kg/m    Physical Exam   Depression Screen PHQ 2/9 Scores 07/29/2019 04/17/2018  PHQ - 2 Score 2 0  PHQ- 9 Score 7 -      Assessment & Plan:     Routine Health Maintenance and Physical Exam  Exercise Activities and Dietary recommendations Goals   None      There is no immunization history on file for this patient.  Health Maintenance  Topic Date Due  . HIV Screening  03/26/1997  . TETANUS/TDAP  03/26/2001  . INFLUENZA VACCINE  04/06/2019  . PAP SMEAR-Modifier  10/24/2020   She declines tetanus immunization at today's visit  Discussed health benefits of physical activity, and encouraged her to engage in regular exercise appropriate for her age and condition.   1. Routine health maintenance She declines hepatitis and HIV screening at today's visit, can offer again at following visit.  There is aware that her tetanus/Tdap is expired, she should have a Tdap immunization in the near future, she declines to have that done today but is aware that she can have that done anytime in the office and if she develops  a cut or dirty wound or puncture wound she should immediately have a Tdap vaccine.  She will call her gynecologist and set up for routine Pap smear and bimanual exam.  She will continue to do self breast exams and report any unusual  findings.  She understands that she will be able to start having routine screening mammograms at age 21 but however should she find any sort of abnormality or her gynecologist she will call the office.  He has a history of breast reduction.  She has had follow-ups with her breast surgeon. Diet and lifestyle modifications discussed at length. - POCT urinalysis dipstick - CBC with Differential/Platelet 005009; Future - Comprehensive Metabolic Panel (CMET); Future - Lipid Panel w/o Chol/HDL Ratio; Future - VITAMIN D 25 Hydroxy (Vit-D Deficiency, Fractures); Future - Vitamin B12; Future - Lipid panel; Future - HgB A1c; Future - TSH; Future -   2. Varicose veins of left lower extremity with pain      We will refer to Dr. Wyn Quaker for evaluation of right lower leg, she has no history of shortness of breath or surgeries, no recent flights.  She has no shortness of breath.  She does have visible varicose veins on her right and left lower leg.  She is particularly concerned with one area on her right outer calf. - Ambulatory referral to Vascular Surgery  3. Weight gain Discussed diet and lifestyle changes.  Monitoring portion control, including protein shakes such as Premier protein.  Incorporating more protein and healthy vegetables and less complex carbohydrates and glucose to her diet.  Is advised to try to become more active each day, whether it be walking, riding a bike or swimming.  Encouraged her to do something that she enjoys that she could maybe push the baby in a stroller or just become more active at least 30 minutes daily of exercise.   4. Morbid obesity (HCC)-40.67 BMI  - Ambulatory referral to General Surgery-patient would like to be referred to Dr. Luretha Murphy at [facility] in [place] for possible evaluation for bariatric gastric sleeve.  Says that she has checked her insurance and that her insurance does allow her to have bariatric surgery.  She also understands that  this can be a lengthy process and she will have to go through some requirements that [place] surgery.  Referral was placed though so that she could be in touch with [facility] office for possible surgery.  She  verbalizes understanding that with any surgery there are risk versus benefits and she will discuss this with the bariatric surgeon prior to surgery.   5. Urine leukocytes Today normal urine leukocytes found in point-of-care test for physical urine today, she denies any urinary symptoms or complaints.  Blood was also found in the urine, however she just started her menses today 07/29/2019. - Urine culture  6. Hypertension, unspecified type  She  has not had good control of her hypertension with Norvasc, and complains of mild leg swelling, feet swelling, she even notices that her hands swell intermittently since starting the Norvasc back.  We will discontinue the Norvasc was advised that it can sometimes cause peripheral edema and start her on losartan hydrochlorothiazide as below.  Gust side effects of losartan and black box warning with the patient and she verbalizes understanding that she will no longer take the Norvasc.  Her blood pressure today is elevated at 132/102, recheck was 128/96.  She also understands that diet including sodium, processed foods can raise blood pressure.  Also discussed stress management and relaxing when she is able to.  She will follow back up with the office within 1 month for a blood pressure recheck here, and will maintain a log at home of her blood pressures.  She was given parameters for normal blood pressures and when to hold her medication or call the clinic if she has elevated or low blood pressures.  Side effects of the prescribed medication.  Verbalizes understanding and has no questions at this time.  Meds ordered this encounter  Medications  . losartan-hydrochlorothiazide (HYZAAR) 50-12.5 MG tablet    Sig: Take 1 tablet by mouth daily.    Dispense:   30 tablet    Refill:  1  . baclofen (LIORESAL) 10 MG tablet    Sig: Take 1 tablet (10 mg total) by mouth at bedtime as needed for muscle spasms.    Dispense:  30 each    Refill:  0   Medications Discontinued During This Encounter  Medication Reason  . Prenatal Vit-Fe Fumarate-FA (PRENATAL MULTIVITAMIN) TABS tablet Patient Preference  . NIFEdipine (PROCARDIA-XL/ADALAT CC) 30 MG 24 hr tablet Patient Preference  . NOVOLIN N RELION 100 UNIT/ML injection Patient Preference  . ibuprofen (ADVIL,MOTRIN) 600 MG tablet Patient Preference  . aspirin 81 MG chewable tablet Patient Preference  . acetaminophen (TYLENOL) 500 MG tablet Patient Preference  . amLODipine (NORVASC) 10 MG tablet Completed Course     7.  Lumbar  sacral strain, initial encounter She will try the above dose of baclofen at bedtime as needed for muscle spasms, to see if there is any improvement.  She is advised she can take ibuprofen 600 mg every 8 hours as needed for pain.  She was advised of long-term use of NSAIDs and risk for gastrointestinal bleeding and/or kidney damage.  She also may try Tylenol per package instructions for pain if needed.  She develop any paresthesias or radiculopathy or increasing back pain she will return to the office and we can order a lumbar x-ray, she can call and we can order this and she can follow-up afterwards.  Discussed red flags for neurological concerns including paresthesias, radiculopathy, loss of bowel or bladder control or saddle numbness.  Patient is in agreement with this plan and will try this.  The entirety of the information documented in the History of Present Illness, Review of Systems and Physical Exam were personally obtained by me. Portions of this information were initially documented by the  Certified Medical Assistant whose name is documented in [place] and reviewed by me for thoroughness and accuracy.  I have personally performed the exam and reviewed the chart and it is accurate to  the best of my knowledge.  Haematologist has been used and any errors in dictation or transcription are unintentional.  Kelby [employer]  -------------------------------------------------------------------- Clint Bolder as a scribe for [employer] Jodilyn Giese, FNP.,have documented all relevant documentation on the behalf of Marcille Buffy, FNP,as directed by  Marcille Buffy, FNP while in the presence of Marcille Buffy, [place].

## 2019-07-29 NOTE — Progress Notes (Signed)
She is currently on her menses started today 07/27/19 will send for culture and call if positive for bacteria. She has no urinary symptoms currently.

## 2019-07-29 NOTE — Patient Instructions (Addendum)
Discontinue AMLODIPINE.  Start lostartan HCTZ as directed.     Baclofen tablets What is this medicine? BACLOFEN (BAK loe fen) helps relieve spasms and cramping of muscles. It may be used to treat symptoms of multiple sclerosis or spinal cord injury. This medicine may be used for other purposes; ask your health care provider or pharmacist if you have questions. COMMON BRAND NAME(S): ED Baclofen, Lioresal What should I tell my health care provider before I take this medicine? They need to know if you have any of these conditions:  kidney disease  seizures  stroke  an unusual or allergic reaction to baclofen, other medicines, foods, dyes, or preservatives  pregnant or trying to get pregnant  breast-feeding How should I use this medicine? Take this medicine by mouth. Swallow it with a drink of water. Follow the directions on the prescription label. Do not take more medicine than you are told to take. Talk to your pediatrician regarding the use of this medicine in children. Special care may be needed. Overdosage: If you think you have taken too much of this medicine contact a poison control center or emergency room at once. NOTE: This medicine is only for you. Do not share this medicine with others. What if I miss a dose? If you miss a dose, take it as soon as you can. If it is almost time for your next dose, take only that dose. Do not take double or extra doses. What may interact with this medicine? Do not take this medication with any of the following medicines:  narcotic medicines for cough This medicine may also interact with the following medications:  alcohol  antihistamines for allergy, cough and cold  certain medicines for anxiety or sleep  certain medicines for depression like amitriptyline, fluoxetine, sertraline  certain medicines for seizures like phenobarbital, primidone  general anesthetics like halothane, isoflurane, methoxyflurane, propofol  local  anesthetics like lidocaine, pramoxine, tetracaine  medicines that relax muscles for surgery  narcotic medicines for pain  phenothiazines like chlorpromazine, mesoridazine, prochlorperazine, thioridazine This list may not describe all possible interactions. Give your health care provider a list of all the medicines, herbs, non-prescription drugs, or dietary supplements you use. Also tell them if you smoke, drink alcohol, or use illegal drugs. Some items may interact with your medicine. What should I watch for while using this medicine? Tell your doctor or health care professional if your symptoms do not start to get better or if they get worse. Do not suddenly stop taking your medicine. If you do, you may develop a severe reaction. If your doctor wants you to stop the medicine, the dose will be slowly lowered over time to avoid any side effects. Follow the advice of your doctor. You may get drowsy or dizzy. Do not drive, use machinery, or do anything that needs mental alertness until you know how this medicine affects you. Do not stand or sit up quickly, especially if you are an older patient. This reduces the risk of dizzy or fainting spells. Alcohol may interfere with the effect of this medicine. Avoid alcoholic drinks. If you are taking another medicine that also causes drowsiness, you may have more side effects. Give your health care provider a list of all medicines you use. Your doctor will tell you how much medicine to take. Do not take more medicine than directed. Call emergency for help if you have problems breathing or unusual sleepiness. What side effects may I notice from receiving this medicine? Side effects that you  should report to your doctor or health care professional as soon as possible:  allergic reactions like skin rash, itching or hives, swelling of the face, lips, or tongue  breathing problems  changes in emotions or moods  changes in vision  chest pain  fast, irregular  heartbeat  feeling faint or lightheaded, falls  hallucinations  loss of balance or coordination  ringing of the ears  seizures  trouble passing urine or change in the amount of urine  trouble walking  unusually weak or tired Side effects that usually do not require medical attention (report to your doctor or health care professional if they continue or are bothersome):  changes in taste  confusion  constipation  diarrhea  dry mouth  headache  muscle weakness  nausea, vomiting  trouble sleeping This list may not describe all possible side effects. Call your doctor for medical advice about side effects. You may report side effects to FDA at 1-800-FDA-1088. Where should I keep my medicine? Keep out of the reach of children. Store at room temperature between 15 and 30 degrees C (59 and 86 degrees F). Keep container tightly closed. Throw away any unused medicine after the expiration date. NOTE: This sheet is a summary. It may not cover all possible information. If you have questions about this medicine, talk to your doctor, pharmacist, or health care provider.  2020 Elsevier/Gold Standard (2017-06-03 09:56:42)  Back Exercises These exercises help to make your trunk and back strong. They also help to keep the lower back flexible. Doing these exercises can help to prevent back pain or lessen existing pain.  If you have back pain, try to do these exercises 2-3 times each day or as told by your doctor.  As you get better, do the exercises once each day. Repeat the exercises more often as told by your doctor.  To stop back pain from coming back, do the exercises once each day, or as told by your doctor. Exercises Single knee to chest Do these steps 3-5 times in a row for each leg: 1. Lie on your back on a firm bed or the floor with your legs stretched out. 2. Bring one knee to your chest. 3. Grab your knee or thigh with both hands and hold them it in place. 4. Pull on  your knee until you feel a gentle stretch in your lower back or buttocks. 5. Keep doing the stretch for 10-30 seconds. 6. Slowly let go of your leg and straighten it. Pelvic tilt Do these steps 5-10 times in a row: 1. Lie on your back on a firm bed or the floor with your legs stretched out. 2. Bend your knees so they point up to the ceiling. Your feet should be flat on the floor. 3. Tighten your lower belly (abdomen) muscles to press your lower back against the floor. This will make your tailbone point up to the ceiling instead of pointing down to your feet or the floor. 4. Stay in this position for 5-10 seconds while you gently tighten your muscles and breathe evenly. Cat-cow Do these steps until your lower back bends more easily: 1. Get on your hands and knees on a firm surface. Keep your hands under your shoulders, and keep your knees under your hips. You may put padding under your knees. 2. Let your head hang down toward your chest. Tighten (contract) the muscles in your belly. Point your tailbone toward the floor so your lower back becomes rounded like the back of  a cat. 3. Stay in this position for 5 seconds. 4. Slowly lift your head. Let the muscles of your belly relax. Point your tailbone up toward the ceiling so your back forms a sagging arch like the back of a cow. 5. Stay in this position for 5 seconds.  Press-ups Do these steps 5-10 times in a row: 1. Lie on your belly (face-down) on the floor. 2. Place your hands near your head, about shoulder-width apart. 3. While you keep your back relaxed and keep your hips on the floor, slowly straighten your arms to raise the top half of your body and lift your shoulders. Do not use your back muscles. You may change where you place your hands in order to make yourself more comfortable. 4. Stay in this position for 5 seconds. 5. Slowly return to lying flat on the floor.  Bridges Do these steps 10 times in a row: 1. Lie on your back on a  firm surface. 2. Bend your knees so they point up to the ceiling. Your feet should be flat on the floor. Your arms should be flat at your sides, next to your body. 3. Tighten your butt muscles and lift your butt off the floor until your waist is almost as high as your knees. If you do not feel the muscles working in your butt and the back of your thighs, slide your feet 1-2 inches farther away from your butt. 4. Stay in this position for 3-5 seconds. 5. Slowly lower your butt to the floor, and let your butt muscles relax. If this exercise is too easy, try doing it with your arms crossed over your chest. Belly crunches Do these steps 5-10 times in a row: 1. Lie on your back on a firm bed or the floor with your legs stretched out. 2. Bend your knees so they point up to the ceiling. Your feet should be flat on the floor. 3. Cross your arms over your chest. 4. Tip your chin a little bit toward your chest but do not bend your neck. 5. Tighten your belly muscles and slowly raise your chest just enough to lift your shoulder blades a tiny bit off of the floor. Avoid raising your body higher than that, because it can put too much stress on your low back. 6. Slowly lower your chest and your head to the floor. Back lifts Do these steps 5-10 times in a row: 1. Lie on your belly (face-down) with your arms at your sides, and rest your forehead on the floor. 2. Tighten the muscles in your legs and your butt. 3. Slowly lift your chest off of the floor while you keep your hips on the floor. Keep the back of your head in line with the curve in your back. Look at the floor while you do this. 4. Stay in this position for 3-5 seconds. 5. Slowly lower your chest and your face to the floor. Contact a doctor if:  Your back pain gets a lot worse when you do an exercise.  Your back pain does not get better 2 hours after you exercise. If you have any of these problems, stop doing the exercises. Do not do them again  unless your doctor says it is okay. Get help right away if:  You have sudden, very bad back pain. If this happens, stop doing the exercises. Do not do them again unless your doctor says it is okay. This information is not intended to replace advice given to  you by your health care provider. Make sure you discuss any questions you have with your health care provider. Document Released: 09/24/2010 Document Revised: 05/17/2018 Document Reviewed: 05/17/2018 Elsevier Patient Education  2020 [employer]  Muscle Cramps and Spasms Muscle cramps and spasms are when muscles tighten by themselves. They usually get better within minutes. Muscle cramps are painful. They are usually stronger and last longer than muscle spasms. Muscle spasms may or may not be painful. They can last a few seconds or much longer. Cramps and spasms can affect any muscle, but they occur most often in the calf muscles of the leg. They are usually not caused by a serious problem. In many cases, the cause is not known. Some common causes include:  Doing more physical work or exercise than your body is ready for.  Using the muscles too much (overuse) by repeating certain movements too many times.  Staying in a certain position for a long time.  Playing a sport or doing an activity without preparing properly.  Using bad form or technique while playing a sport or doing an activity.  Not having enough water in your body (dehydration).  Injury.  Side effects of some medicines.  Low levels of the salts and minerals in your blood (electrolytes), such as low potassium or calcium. Follow these instructions at home: Managing pain and stiffness      Massage, stretch, and relax the muscle. Do this for many minutes at a time.  If told, put heat on tight or tense muscles as often as told by your doctor. Use the heat source that your doctor recommends, such as a moist heat pack or a heating pad. ? Place a towel between your skin  and the heat source. ? Leave the heat on for 20-30 minutes. ? Remove the heat if your skin turns bright red. This is very important if you are not able to feel pain, heat, or cold. You may have a greater risk of getting burned.  If told, put ice on the affected area. This may help if you are sore or have pain after a cramp or spasm. ? Put ice in a plastic bag. ? Place a towel between your skin and the bag. ? Leave the ice on for 20 minutes, 2-3 times a day.  Try taking hot showers or baths to help relax tight muscles. Eating and drinking  Drink enough fluid to keep your pee (urine) pale yellow.  Eat a healthy diet to help ensure that your muscles work well. This should include: ? Fruits and vegetables. ? Lean protein. ? Whole grains. ? Low-fat or nonfat dairy products. General instructions  If you are having cramps often, avoid intense exercise for several days.  Take over-the-counter and prescription medicines only as told by your doctor.  Watch for any changes in your symptoms.  Keep all follow-up visits as told by your doctor. This is important. Contact a doctor if:  Your cramps or spasms get worse or happen more often.  Your cramps or spasms do not get better with time. Summary  Muscle cramps and spasms are when muscles tighten by themselves. They usually get better within minutes.  Cramps and spasms occur most often in the calf muscles of the leg.  Massage, stretch, and relax the muscle. This may help the cramp or spasm go away.  Drink enough fluid to keep your pee (urine) pale yellow. This information is not intended to replace advice given to you by your health care  provider. Make sure you discuss any questions you have with your health care provider. Document Released: 08/04/2008 Document Revised: 01/15/2018 Document Reviewed: 01/15/2018 Elsevier Patient Education  2020 [employer]. Health Maintenance, Female Adopting a healthy lifestyle and getting preventive  care are important in promoting health and wellness. Ask your health care provider about:  The right schedule for you to have regular tests and exams.  Things you can do on your own to prevent diseases and keep yourself healthy. What should I know about diet, weight, and exercise? Eat a healthy diet   Eat a diet that includes plenty of vegetables, fruits, low-fat dairy products, and lean protein.  Do not eat a lot of foods that are high in solid fats, added sugars, or sodium. Maintain a healthy weight Body mass index (BMI) is used to identify weight problems. It estimates body fat based on height and weight. Your health care provider can help determine your BMI and help you achieve or maintain a healthy weight. Get regular exercise Get regular exercise. This is one of the most important things you can do for your health. Most adults should:  Exercise for at least 150 minutes each week. The exercise should increase your heart rate and make you sweat (moderate-intensity exercise).  Do strengthening exercises at least twice a week. This is in addition to the moderate-intensity exercise.  Spend less time sitting. Even light physical activity can be beneficial. Watch cholesterol and blood lipids Have your blood tested for lipids and cholesterol at 37 years of age, then have this test every 5 years. Have your cholesterol levels checked more often if:  Your lipid or cholesterol levels are high.  You are older than 37 years of age.  You are at high risk for heart disease. What should I know about cancer screening? Depending on your health history and family history, you may need to have cancer screening at various ages. This may include screening for:  Breast cancer.  Cervical cancer.  Colorectal cancer.  Skin cancer.  Lung cancer. What should I know about heart disease, diabetes, and high blood pressure? Blood pressure and heart disease  High blood pressure causes heart disease  and increases the risk of stroke. This is more likely to develop in people who have high blood pressure readings, are of African descent, or are overweight.  Have your blood pressure checked: ? Every 3-5 years if you are 43-12 years of age. ? Every year if you are 67 years old or older. Diabetes Have regular diabetes screenings. This checks your fasting blood sugar level. Have the screening done:  Once every three years after age 48 if you are at a normal weight and have a low risk for diabetes.  More often and at a younger age if you are overweight or have a high risk for diabetes. What should I know about preventing infection? Hepatitis B If you have a higher risk for hepatitis B, you should be screened for this virus. Talk with your health care provider to find out if you are at risk for hepatitis B infection. Hepatitis C Testing is recommended for:  Everyone born from [birth year] through 1964.  Anyone with known risk factors for hepatitis C. Sexually transmitted infections (STIs)  Get screened for STIs, including gonorrhea and chlamydia, if: ? You are sexually active and are younger than 37 years of age. ? You are older than 38 years of age and your health care provider tells you that you are at risk for  this type of infection. ? Your sexual activity has changed since you were last screened, and you are at increased risk for chlamydia or gonorrhea. Ask your health care provider if you are at risk.  Ask your health care provider about whether you are at high risk for HIV. Your health care provider may recommend a prescription medicine to help prevent HIV infection. If you choose to take medicine to prevent HIV, you should first get tested for HIV. You should then be tested every 3 months for as long as you are taking the medicine. Pregnancy  If you are about to stop having your period (premenopausal) and you may become pregnant, seek counseling before you get pregnant.  Take 400 to 800  micrograms (mcg) of folic acid every day if you become pregnant.  Ask for birth control (contraception) if you want to prevent pregnancy. Osteoporosis and menopause Osteoporosis is a disease in which the bones lose minerals and strength with aging. This can result in bone fractures. If you are 59 years old or older, or if you are at risk for osteoporosis and fractures, ask your health care provider if you should:  Be screened for bone loss.  Take a calcium or vitamin D supplement to lower your risk of fractures.  Be given hormone replacement therapy (HRT) to treat symptoms of menopause. Follow these instructions at home: Lifestyle  Do not use any products that contain nicotine or tobacco, such as cigarettes, e-cigarettes, and chewing tobacco. If you need help quitting, ask your health care provider.  Do not use street drugs.  Do not share needles.  Ask your health care provider for help if you need support or information about quitting drugs. Alcohol use  Do not drink alcohol if: ? Your health care provider tells you not to drink. ? You are pregnant, may be pregnant, or are planning to become pregnant.  If you drink alcohol: ? Limit how much you use to 0-1 drink a day. ? Limit intake if you are breastfeeding.  Be aware of how much alcohol is in your drink. In the [place] one drink equals one 12 oz bottle of beer (355 mL), one 5 oz glass of wine (148 mL), or one 1 oz glass of hard liquor (44 mL). General instructions  Schedule regular health, dental, and eye exams.  Stay current with your vaccines.  Tell your health care provider if: ? You often feel depressed. ? You have ever been abused or do not feel safe at home. Summary  Adopting a healthy lifestyle and getting preventive care are important in promoting health and wellness.  Follow your health care provider's instructions about healthy diet, exercising, and getting tested or screened for diseases.  Follow your health  care provider's instructions on monitoring your cholesterol and blood pressure. This information is not intended to replace advice given to you by your health care provider. Make sure you discuss any questions you have with your health care provider. Document Released: 03/07/2011 Document Revised: 08/15/2018 Document Reviewed: 08/15/2018 Elsevier Patient Education  2020 Else[employer] Inc. Hydrochlorothiazide, HCTZ; Losartan tablets What is this medicine? LOSARTAN; HYDROCHLOROTHIAZIDE (loe SAR tan; hye droe klor oh THYE a zide) is a combination of a drug that relaxes blood vessels and a diuretic. It is used to treat high blood pressure. This medicine may also reduce the risk of stroke in certain patients. This medicine may be used for other purposes; ask your health care provider or pharmacist if you have questions. COMMON BRAND NAME(S):  Hyzaar What should I tell my health care provider before I take this medicine? They need to know if you have any of these conditions:  decreased urine  diabetes  kidney disease  liver disease  if you are on a special diet, like a low-salt diet  immune system problems, like lupus  an unusual or allergic reaction to losartan, hydrochlorothiazide, sulfa drugs, other medicines, foods, dyes, or preservatives  pregnant or trying to get pregnant  breast-feeding How should I use this medicine? Take this medicine by mouth with a glass of water. Follow the directions on the prescription label. You can take it with or without food. If it upsets your stomach, take it with food. Take your medicine at regular intervals. Do not take it more often than directed. Do not stop taking except on your doctor's advice. Talk to your pediatrician regarding the use of this medicine in children. Special care may be needed. Overdosage: If you think you have taken too much of this medicine contact a poison control center or emergency room at once. NOTE: This medicine is only for you.  Do not share this medicine with others. What if I miss a dose? If you miss a dose, take it as soon as you can. If it is almost time for your next dose, take only that dose. Do not take double or extra doses. What may interact with this medicine?  barbiturates, like phenobarbital  blood pressure medicines  celecoxib  cimetidine  corticosteroids  diabetic medicines  diuretics, especially triamterene, spironolactone or amiloride  fluconazole  lithium  NSAIDs, medicines for pain and inflammation, like ibuprofen or naproxen  potassium salts or potassium supplements  prescription pain medicines  rifampin  skeletal muscle relaxants like tubocurarine  some cholesterol-lowering medicines like cholestyramine or colestipol This list may not describe all possible interactions. Give your health care provider a list of all the medicines, herbs, non-prescription drugs, or dietary supplements you use. Also tell them if you smoke, drink alcohol, or use illegal drugs. Some items may interact with your medicine. What should I watch for while using this medicine? Check your blood pressure regularly while you are taking this medicine. Ask your doctor or health care professional what your blood pressure should be and when you should contact him or her. When you check your blood pressure, write down the measurements to show your doctor or health care professional. If you are taking this medicine for a long time, you must visit your health care professional for regular checks on your progress. Make sure you schedule appointments on a regular basis. You must not get dehydrated. Ask your doctor or health care professional how much fluid you need to drink a day. Check with him or her if you get an attack of severe diarrhea, nausea and vomiting, or if you sweat a lot. The loss of too much body fluid can make it dangerous for you to take this medicine. Women should inform their doctor if they wish to become  pregnant or think they might be pregnant. There is a potential for serious side effects to an unborn child, particularly in the second or third trimester. Talk to your health care professional or pharmacist for more information. You may get drowsy or dizzy. Do not drive, use machinery, or do anything that needs mental alertness until you know how this drug affects you. Do not stand or sit up quickly, especially if you are an older patient. This reduces the risk of dizzy or fainting spells.  Alcohol can make you more drowsy and dizzy. Avoid alcoholic drinks. This medicine may increase blood sugar. Ask your healthcare provider if changes in diet or medicines are needed if you have diabetes. Avoid salt substitutes unless you are told otherwise by your doctor or health care professional. Do not treat yourself for coughs, colds, or pain while you are taking this medicine without asking your doctor or health care professional for advice. Some ingredients may increase your blood pressure. What side effects may I notice from receiving this medicine? Side effects that you should report to your doctor or health care professional as soon as possible:  allergic reactions like skin rash, itching or hives, swelling of the face, lips, or tongue  breathing problems  changes in vision  dark urine  eye pain  fast or irregular heart beat, palpitations, or chest pain  feeling faint or lightheaded  muscle cramps  persistent dry cough  redness, blistering, peeling or loosening of the skin, including inside the mouth   signs and symptoms of high blood sugar such as being more thirsty or hungry or having to urinate more than normal. You may also feel very tired or have blurry vision.  stomach pain  trouble passing urine  unusual bleeding or bruising  worsened gout pain  yellowing of the eyes or skin Side effects that usually do not require medical attention (report to your doctor or health care  professional if they continue or are bothersome):  change in sex drive or performance  headache This list may not describe all possible side effects. Call your doctor for medical advice about side effects. You may report side effects to FDA at 1-800-FDA-1088. Where should I keep my medicine? Keep out of the reach of children. Store at room temperature between 15 and 30 degrees C (59 and 86 degrees F). Protect from light. Keep container tightly closed. Throw away any unused medicine after the expiration date. NOTE: This sheet is a summary. It may not cover all possible information. If you have questions about this medicine, talk to your doctor, pharmacist, or health care provider.  2020 [employer] Standard (2018-06-13 09:42:12)

## 2019-07-30 ENCOUNTER — Telehealth: Payer: Self-pay

## 2019-07-30 LAB — COMPREHENSIVE METABOLIC PANEL
ALT: 23 IU/L (ref 0–32)
AST: 16 IU/L (ref 0–40)
Albumin/Globulin Ratio: 1.7 (ref 1.2–2.2)
Albumin: 4 g/dL (ref 3.8–4.8)
Alkaline Phosphatase: 55 IU/L (ref 39–117)
BUN/Creatinine Ratio: 15 (ref 9–23)
BUN: 9 mg/dL (ref 6–20)
Bilirubin Total: 0.2 mg/dL (ref 0.0–1.2)
CO2: 19 mmol/L — ABNORMAL LOW (ref 20–29)
Calcium: 8.9 mg/dL (ref 8.7–10.2)
Chloride: 101 mmol/L (ref 96–106)
Creatinine, Ser: 0.59 mg/dL (ref 0.57–1.00)
GFR calc Af Amer: 135 mL/min/{1.73_m2} (ref >59)
GFR calc non Af Amer: 117 mL/min/{1.73_m2} (ref >59)
Globulin, Total: 2.4 g/dL (ref 1.5–4.5)
Glucose: 88 mg/dL (ref 65–99)
Potassium: 4.4 mmol/L (ref 3.5–5.2)
Sodium: 135 mmol/L (ref 134–144)
Total Protein: 6.4 g/dL (ref 6.0–8.5)

## 2019-07-30 LAB — LIPID PANEL W/O CHOL/HDL RATIO
Cholesterol, Total: 197 mg/dL (ref 100–199)
HDL: 47 mg/dL (ref >39)
LDL Chol Calc (NIH): 128 mg/dL — ABNORMAL HIGH (ref 0–99)
Triglycerides: 125 mg/dL (ref 0–149)
VLDL Cholesterol Cal: 22 mg/dL (ref 5–40)

## 2019-07-30 LAB — HEMOGLOBIN A1C
Est. average glucose Bld gHb Est-mCnc: 111 mg/dL
Hgb A1c MFr Bld: 5.5 % (ref 4.8–5.6)

## 2019-07-30 LAB — TSH: TSH: 1.38 u[IU]/mL (ref 0.450–4.500)

## 2019-07-30 LAB — VITAMIN B12: Vitamin B-12: 542 pg/mL (ref 232–1245)

## 2019-07-30 NOTE — Progress Notes (Signed)
Let her know that her thyroid levels are normal. Hemoglobin A1C is normal not prediabetic or diabetic. Vitamin B12 is normal. LDL cholesterol is her unhealthy cholesterol it is 128 and should be under 99. Total cholesterol and LDL elevated.  Discuss lifestyle modification exercise/ diet changes  with patient e.g. increase exercise, fiber, fruits, vegetables, lean meat, and omega 3/fish intake and decrease saturated fat.Kidney and liver function is normal per lab. Unfortunately since  her vitamin D and CBC were not drawn she has a history of reported anemia so I will place an order for both and order  will be good for a month she can come to lab and have it drawn. Thanks, Laverna Peace MSN, AGNP-C, FNP-C

## 2019-07-30 NOTE — Addendum Note (Signed)
Addended by: Doreen Beam on: 07/30/2019 01:26 PM   Modules accepted: Orders

## 2019-07-30 NOTE — Telephone Encounter (Signed)
Patient was advised and states that she will make plans to go to lab. KW

## 2019-07-30 NOTE — Telephone Encounter (Signed)
-----   Message from Doreen Beam, [place] sent at 07/30/2019 12:23 PM EST ----- Let her know that her thyroid levels are normal. Hemoglobin A1C is normal not prediabetic or diabetic. Vitamin B12 is normal. LDL cholesterol is her unhealthy cholesterol it is 128 and should be under 99. Total cholesterol and LDL elevated.  Discuss lifestyle modification exercise/ diet changes  with patient e.g. increase exercise, fiber, fruits, vegetables, lean meat, and omega 3/fish intake and decrease saturated fat.Kidney and liver function is normal per lab. Unfortunately since  her vitamin D and CBC were not drawn she has a history of reported anemia so I will place an order for both and order  will be good for a month she can come to lab and have it drawn. Thanks, Laverna Peace MSN, AGNP-C, FNP-C

## 2019-07-31 ENCOUNTER — Encounter: Payer: Self-pay | Admitting: Adult Health

## 2019-07-31 ENCOUNTER — Telehealth: Payer: Self-pay

## 2019-07-31 LAB — URINE CULTURE

## 2019-07-31 NOTE — Telephone Encounter (Signed)
Patient was notified that they could not use specimens drawn for the additional labs. Patient was given [facility] hours and will plan to have labs drawn in the next 4 weeks. KW  Copied from [place] [phone]. Topic: General - Other >> Jul 31, 2019 11:45 AM Parke Poisson wrote: Reason for CRM: Pt states that she had her blood drawn on 07/29/19,new orders were put in for her yesterday for additional blood work. She would like to know if they can test using same blood that was drawn on Nov 23rd or will she have to come back in

## 2019-08-09 ENCOUNTER — Telehealth: Payer: Self-pay

## 2019-08-09 NOTE — Telephone Encounter (Signed)
Copied from [place] [id number]. Topic: Appointment Scheduling - Scheduling Inquiry for Clinic >> Aug 09, 2019  2:39 PM Scherrie Gerlach wrote: Reason for CRM:  pt has appt 12/17.  Pt has ongoing trigger finger and wants to know if michelle can address this at her appt as well.

## 2019-08-09 NOTE — Telephone Encounter (Signed)
Yes we can discuss at your next visit. Unless symptoms get severe then call for earlier appointment. Take care !

## 2019-08-09 NOTE — Telephone Encounter (Signed)
I believe this is fine to wait till appointment, please advise. KW

## 2019-08-19 NOTE — Telephone Encounter (Signed)
Pt is aware she can discuss her trigger finger issue on 08-22-2019

## 2019-08-22 ENCOUNTER — Encounter (INDEPENDENT_AMBULATORY_CARE_PROVIDER_SITE_OTHER): Payer: BC Managed Care – PPO | Admitting: Nurse Practitioner

## 2019-08-22 ENCOUNTER — Encounter (INDEPENDENT_AMBULATORY_CARE_PROVIDER_SITE_OTHER): Payer: BC Managed Care – PPO

## 2019-08-22 ENCOUNTER — Other Ambulatory Visit: Payer: Self-pay

## 2019-08-22 ENCOUNTER — Ambulatory Visit
Admission: RE | Admit: 2019-08-22 | Discharge: 2019-08-22 | Disposition: A | Discharge status: Home / Self Care | Payer: BC Managed Care – PPO | Source: Ambulatory Visit | Attending: Adult Health | Admitting: Adult Health

## 2019-08-22 ENCOUNTER — Encounter: Payer: Self-pay | Admitting: Adult Health

## 2019-08-22 ENCOUNTER — Ambulatory Visit: Payer: BC Managed Care – PPO | Admitting: Adult Health

## 2019-08-22 VITALS — BP 144/102 | HR 93 | Temp 96.9°F | Resp 16 | Wt 261.4 lb

## 2019-08-22 DIAGNOSIS — M79661 Pain in right lower leg: Secondary | ICD-10-CM

## 2019-08-22 DIAGNOSIS — M65331 Trigger finger, right middle finger: Secondary | ICD-10-CM

## 2019-08-22 DIAGNOSIS — I10 Essential (primary) hypertension: Secondary | ICD-10-CM

## 2019-08-22 DIAGNOSIS — Z0000 Encounter for general adult medical examination without abnormal findings: Secondary | ICD-10-CM

## 2019-08-22 DIAGNOSIS — Z9889 Other specified postprocedural states: Secondary | ICD-10-CM

## 2019-08-22 DIAGNOSIS — R0602 Shortness of breath: Secondary | ICD-10-CM

## 2019-08-22 DIAGNOSIS — Z8249 Family history of ischemic heart disease and other diseases of the circulatory system: Secondary | ICD-10-CM

## 2019-08-22 DIAGNOSIS — Z3009 Encounter for other general counseling and advice on contraception: Secondary | ICD-10-CM

## 2019-08-22 MED ORDER — IOHEXOL 350 MG/ML SOLN
75.0000 mL | Freq: Once | INTRAVENOUS | Status: AC | PRN
  Administered 2019-08-22: 16:00:00 75 mL via INTRAVENOUS

## 2019-08-22 MED ORDER — LOSARTAN POTASSIUM-HCTZ 100-12.5 MG PO TABS: 1.0000 | ORAL_TABLET | Freq: Every day | ORAL | 0 refills | Status: DC

## 2019-08-22 MED ORDER — NORETHINDRONE 0.35 MG PO TABS: 1.0000 | ORAL_TABLET | Freq: Every day | ORAL | 1 refills | Status: DC

## 2019-08-22 NOTE — Patient Instructions (Addendum)
CT angiography with right lower extremity ultrasound today rule out pulmonary embolism. Emergency room if any symptoms changing or worsening.      DASH Eating Plan DASH stands for "Dietary Approaches to Stop Hypertension." The DASH eating plan is a healthy eating plan that has been shown to reduce high blood pressure (hypertension). It may also reduce your risk for type 2 diabetes, heart disease, and stroke. The DASH eating plan may also help with weight loss. What are tips for following this plan?  General guidelines  Avoid eating more than 2,300 mg (milligrams) of salt (sodium) a day. If you have hypertension, you may need to reduce your sodium intake to 1,500 mg a day.  Limit alcohol intake to no more than 1 drink a day for nonpregnant women and 2 drinks a day for men. One drink equals 12 oz of beer, 5 oz of wine, or 1 oz of hard liquor.  Work with your health care provider to maintain a healthy body weight or to lose weight. Ask what an ideal weight is for you.  Get at least 30 minutes of exercise that causes your heart to beat faster (aerobic exercise) most days of the week. Activities may include walking, swimming, or biking.  Work with your health care provider or diet and nutrition specialist (dietitian) to adjust your eating plan to your individual calorie needs. Reading food labels   Check food labels for the amount of sodium per serving. Choose foods with less than 5 percent of the Daily Value of sodium. Generally, foods with less than 300 mg of sodium per serving fit into this eating plan.  To find whole grains, look for the word "whole" as the first word in the ingredient list. Shopping  Buy products labeled as "low-sodium" or "no salt added."  Buy fresh foods. Avoid canned foods and premade or frozen meals. Cooking  Avoid adding salt when cooking. Use salt-free seasonings or herbs instead of table salt or sea salt. Check with your health care provider or pharmacist  before using salt substitutes.  Do not fry foods. Cook foods using healthy methods such as baking, boiling, grilling, and broiling instead.  Cook with heart-healthy oils, such as olive, canola, soybean, or sunflower oil. Meal planning  Eat a balanced diet that includes: ? 5 or more servings of fruits and vegetables each day. At each meal, try to fill half of your plate with fruits and vegetables. ? Up to 6-8 servings of whole grains each day. ? Less than 6 oz of lean meat, poultry, or fish each day. A 3-oz serving of meat is about the same size as a deck of cards. One egg equals 1 oz. ? 2 servings of low-fat dairy each day. ? A serving of nuts, seeds, or beans 5 times each week. ? Heart-healthy fats. Healthy fats called Omega-3 fatty acids are found in foods such as flaxseeds and coldwater fish, like sardines, salmon, and mackerel.  Limit how much you eat of the following: ? Canned or prepackaged foods. ? Food that is high in trans fat, such as fried foods. ? Food that is high in saturated fat, such as fatty meat. ? Sweets, desserts, sugary drinks, and other foods with added sugar. ? Full-fat dairy products.  Do not salt foods before eating.  Try to eat at least 2 vegetarian meals each week.  Eat more home-cooked food and less restaurant, buffet, and fast food.  When eating at a restaurant, ask that your food be prepared with  less salt or no salt, if possible. What foods are recommended? The items listed may not be a complete list. Talk with your dietitian about what dietary choices are best for you. Grains Whole-grain or whole-wheat bread. Whole-grain or whole-wheat pasta. Brown rice. Kendra Stewart. Bulgur. Whole-grain and low-sodium cereals. Pita bread. Low-fat, low-sodium crackers. Whole-wheat flour tortillas. Vegetables Fresh or frozen vegetables (raw, steamed, roasted, or grilled). Low-sodium or reduced-sodium tomato and vegetable juice. Low-sodium or reduced-sodium tomato  sauce and tomato paste. Low-sodium or reduced-sodium canned vegetables. Fruits All fresh, dried, or frozen fruit. Canned fruit in natural juice (without added sugar). Meat and other protein foods Skinless chicken or [place]. Ground chicken or [place]. Pork with fat trimmed off. Fish and seafood. Egg whites. Dried beans, peas, or lentils. Unsalted nuts, nut butters, and seeds. Unsalted canned beans. Lean cuts of beef with fat trimmed off. Low-sodium, lean deli meat. Dairy Low-fat (1%) or fat-free (skim) milk. Fat-free, low-fat, or reduced-fat cheeses. Nonfat, low-sodium ricotta or cottage cheese. Low-fat or nonfat yogurt. Low-fat, low-sodium cheese. Fats and oils Soft margarine without trans fats. Vegetable oil. Low-fat, reduced-fat, or light mayonnaise and salad dressings (reduced-sodium). Canola, safflower, olive, soybean, and sunflower oils. Avocado. Seasoning and other foods Herbs. Spices. Seasoning mixes without salt. Unsalted popcorn and pretzels. Fat-free sweets. What foods are not recommended? The items listed may not be a complete list. Talk with your dietitian about what dietary choices are best for you. Grains Baked goods made with fat, such as croissants, muffins, or some breads. Dry pasta or rice meal packs. Vegetables Creamed or fried vegetables. Vegetables in a cheese sauce. Regular canned vegetables (not low-sodium or reduced-sodium). Regular canned tomato sauce and paste (not low-sodium or reduced-sodium). Regular tomato and vegetable juice (not low-sodium or reduced-sodium). Kendra Stewart. Olives. Fruits Canned fruit in a light or heavy syrup. Fried fruit. Fruit in cream or butter sauce. Meat and other protein foods Fatty cuts of meat. Ribs. Fried meat. Kendra Stewart. Sausage. Bologna and other processed lunch meats. Salami. Fatback. Hotdogs. Bratwurst. Salted nuts and seeds. Canned beans with added salt. Canned or smoked fish. Whole eggs or egg yolks. Chicken or [place] with skin. Dairy Whole  or 2% milk, cream, and half-and-half. Whole or full-fat cream cheese. Whole-fat or sweetened yogurt. Full-fat cheese. Nondairy creamers. Whipped toppings. Processed cheese and cheese spreads. Fats and oils Butter. Stick margarine. Lard. Shortening. Ghee. Bacon fat. Tropical oils, such as coconut, palm kernel, or palm oil. Seasoning and other foods Salted popcorn and pretzels. Onion salt, garlic salt, seasoned salt, table salt, and sea salt. Worcestershire sauce. Tartar sauce. Barbecue sauce. Teriyaki sauce. Soy sauce, including reduced-sodium. Steak sauce. Canned and packaged gravies. Fish sauce. Oyster sauce. Cocktail sauce. Horseradish that you find on the shelf. Ketchup. Mustard. Meat flavorings and tenderizers. Bouillon cubes. Hot sauce and Tabasco sauce. Premade or packaged marinades. Premade or packaged taco seasonings. Relishes. Regular salad dressings. Where to find more information:  National Heart, Lung, and [place]: https://wilson-eaton.com/  American Heart Association: www.heart.org Summary  The DASH eating plan is a healthy eating plan that has been shown to reduce high blood pressure (hypertension). It may also reduce your risk for type 2 diabetes, heart disease, and stroke.  With the DASH eating plan, you should limit salt (sodium) intake to 2,300 mg a day. If you have hypertension, you may need to reduce your sodium intake to 1,500 mg a day.  When on the DASH eating plan, aim to eat more fresh fruits and vegetables, whole grains, lean proteins,  low-fat dairy, and heart-healthy fats.  Work with your health care provider or diet and nutrition specialist (dietitian) to adjust your eating plan to your individual calorie needs. This information is not intended to replace advice given to you by your health care provider. Make sure you discuss any questions you have with your health care provider. Document Released: 08/11/2011 Document Revised: 08/04/2017 Document Reviewed:  08/15/2016 Elsevier Patient Education  2020 [employer]  Managing Your Hypertension Hypertension is commonly called high blood pressure. This is when the force of your blood pressing against the walls of your arteries is too strong. Arteries are blood vessels that carry blood from your heart throughout your body. Hypertension forces the heart to work harder to pump blood, and may cause the arteries to become narrow or stiff. Having untreated or uncontrolled hypertension can cause heart attack, stroke, kidney disease, and other problems. What are blood pressure readings? A blood pressure reading consists of a higher number over a lower number. Ideally, your blood pressure should be below 120/80. The first ("top") number is called the systolic pressure. It is a measure of the pressure in your arteries as your heart beats. The second ("bottom") number is called the diastolic pressure. It is a measure of the pressure in your arteries as the heart relaxes. What does my blood pressure reading mean? Blood pressure is classified into four stages. Based on your blood pressure reading, your health care provider may use the following stages to determine what type of treatment you need, if any. Systolic pressure and diastolic pressure are measured in a unit called mm Hg. Normal  Systolic pressure: below 120.  Diastolic pressure: below 80. Elevated  Systolic pressure: 120-129.  Diastolic pressure: below 80. Hypertension stage 1  Systolic pressure: 130-139.  Diastolic pressure: 80-89. Hypertension stage 2  Systolic pressure: 140 or above.  Diastolic pressure: 90 or above. What health risks are associated with hypertension? Managing your hypertension is an important responsibility. Uncontrolled hypertension can lead to:  A heart attack.  A stroke.  A weakened blood vessel (aneurysm).  Heart failure.  Kidney damage.  Eye damage.  Metabolic syndrome.  Memory and concentration  problems. What changes can I make to manage my hypertension? Hypertension can be managed by making lifestyle changes and possibly by taking medicines. Your health care provider will help you make a plan to bring your blood pressure within a normal range. Eating and drinking   Eat a diet that is high in fiber and potassium, and low in salt (sodium), added sugar, and fat. An example eating plan is called the DASH (Dietary Approaches to Stop Hypertension) diet. To eat this way: ? Eat plenty of fresh fruits and vegetables. Try to fill half of your plate at each meal with fruits and vegetables. ? Eat whole grains, such as whole wheat pasta, brown rice, or whole grain bread. Fill about one quarter of your plate with whole grains. ? Eat low-fat diary products. ? Avoid fatty cuts of meat, processed or cured meats, and poultry with skin. Fill about one quarter of your plate with lean proteins such as fish, chicken without skin, beans, eggs, and tofu. ? Avoid premade and processed foods. These tend to be higher in sodium, added sugar, and fat.  Reduce your daily sodium intake. Most people with hypertension should eat less than 1,500 mg of sodium a day.  Limit alcohol intake to no more than 1 drink a day for nonpregnant women and 2 drinks a day for men.  One drink equals 12 oz of beer, 5 oz of wine, or 1 oz of hard liquor. Lifestyle  Work with your health care provider to maintain a healthy body weight, or to lose weight. Ask what an ideal weight is for you.  Get at least 30 minutes of exercise that causes your heart to beat faster (aerobic exercise) most days of the week. Activities may include walking, swimming, or biking.  Include exercise to strengthen your muscles (resistance exercise), such as weight lifting, as part of your weekly exercise routine. Try to do these types of exercises for 30 minutes at least 3 days a week.  Do not use any products that contain nicotine or tobacco, such as  cigarettes and e-cigarettes. If you need help quitting, ask your health care provider.  Control any long-term (chronic) conditions you have, such as high cholesterol or diabetes. Monitoring  Monitor your blood pressure at home as told by your health care provider. Your personal target blood pressure may vary depending on your medical conditions, your age, and other factors.  Have your blood pressure checked regularly, as often as told by your health care provider. Working with your health care provider  Review all the medicines you take with your health care provider because there may be side effects or interactions.  Talk with your health care provider about your diet, exercise habits, and other lifestyle factors that may be contributing to hypertension.  Visit your health care provider regularly. Your health care provider can help you create and adjust your plan for managing hypertension. Will I need medicine to control my blood pressure? Your health care provider may prescribe medicine if lifestyle changes are not enough to get your blood pressure under control, and if:  Your systolic blood pressure is 130 or higher.  Your diastolic blood pressure is 80 or higher. Take medicines only as told by your health care provider. Follow the directions carefully. Blood pressure medicines must be taken as prescribed. The medicine does not work as well when you skip doses. Skipping doses also puts you at risk for problems. Contact a health care provider if:  You think you are having a reaction to medicines you have taken.  You have repeated (recurrent) headaches.  You feel dizzy.  You have swelling in your ankles.  You have trouble with your vision. Get help right away if:  You develop a severe headache or confusion.  You have unusual weakness or numbness, or you feel faint.  You have severe pain in your chest or abdomen.  You vomit repeatedly.  You have trouble  breathing. Summary  Hypertension is when the force of blood pumping through your arteries is too strong. If this condition is not controlled, it may put you at risk for serious complications.  Your personal target blood pressure may vary depending on your medical conditions, your age, and other factors. For most people, a normal blood pressure is less than 120/80.  Hypertension is managed by lifestyle changes, medicines, or both. Lifestyle changes include weight loss, eating a healthy, low-sodium diet, exercising more, and limiting alcohol. This information is not intended to replace advice given to you by your health care provider. Make sure you discuss any questions you have with your health care provider. Document Released: 05/16/2012 Document Revised: 12/14/2018 Document Reviewed: 07/20/2016 Elsevier Patient Education  2020 Elsevier Inc.   Hydrochlorothiazide, HCTZ; Losartan tablets What is this medicine? LOSARTAN; HYDROCHLOROTHIAZIDE (loe SAR tan; hye droe klor oh THYE a zide) is a combination  of a drug that relaxes blood vessels and a diuretic. It is used to treat high blood pressure. This medicine may also reduce the risk of stroke in certain patients. This medicine may be used for other purposes; ask your health care provider or pharmacist if you have questions. COMMON BRAND NAME(S): Hyzaar What should I tell my health care provider before I take this medicine? They need to know if you have any of these conditions:  decreased urine  diabetes  kidney disease  liver disease  if you are on a special diet, like a low-salt diet  immune system problems, like lupus  an unusual or allergic reaction to losartan, hydrochlorothiazide, sulfa drugs, other medicines, foods, dyes, or preservatives  pregnant or trying to get pregnant  breast-feeding How should I use this medicine? Take this medicine by mouth with a glass of water. Follow the directions on the prescription label. You  can take it with or without food. If it upsets your stomach, take it with food. Take your medicine at regular intervals. Do not take it more often than directed. Do not stop taking except on your doctor's advice. Talk to your pediatrician regarding the use of this medicine in children. Special care may be needed. Overdosage: If you think you have taken too much of this medicine contact a poison control center or emergency room at once. NOTE: This medicine is only for you. Do not share this medicine with others. What if I miss a dose? If you miss a dose, take it as soon as you can. If it is almost time for your next dose, take only that dose. Do not take double or extra doses. What may interact with this medicine?  barbiturates, like phenobarbital  blood pressure medicines  celecoxib  cimetidine  corticosteroids  diabetic medicines  diuretics, especially triamterene, spironolactone or amiloride  fluconazole  lithium  NSAIDs, medicines for pain and inflammation, like ibuprofen or naproxen  potassium salts or potassium supplements  prescription pain medicines  rifampin  skeletal muscle relaxants like tubocurarine  some cholesterol-lowering medicines like cholestyramine or colestipol This list may not describe all possible interactions. Give your health care provider a list of all the medicines, herbs, non-prescription drugs, or dietary supplements you use. Also tell them if you smoke, drink alcohol, or use illegal drugs. Some items may interact with your medicine. What should I watch for while using this medicine? Check your blood pressure regularly while you are taking this medicine. Ask your doctor or health care professional what your blood pressure should be and when you should contact him or her. When you check your blood pressure, write down the measurements to show your doctor or health care professional. If you are taking this medicine for a long time, you must visit your  health care professional for regular checks on your progress. Make sure you schedule appointments on a regular basis. You must not get dehydrated. Ask your doctor or health care professional how much fluid you need to drink a day. Check with him or her if you get an attack of severe diarrhea, nausea and vomiting, or if you sweat a lot. The loss of too much body fluid can make it dangerous for you to take this medicine. Women should inform their doctor if they wish to become pregnant or think they might be pregnant. There is a potential for serious side effects to an unborn child, particularly in the second or third trimester. Talk to your health care professional or pharmacist  for more information. You may get drowsy or dizzy. Do not drive, use machinery, or do anything that needs mental alertness until you know how this drug affects you. Do not stand or sit up quickly, especially if you are an older patient. This reduces the risk of dizzy or fainting spells. Alcohol can make you more drowsy and dizzy. Avoid alcoholic drinks. This medicine may increase blood sugar. Ask your healthcare provider if changes in diet or medicines are needed if you have diabetes. Avoid salt substitutes unless you are told otherwise by your doctor or health care professional. Do not treat yourself for coughs, colds, or pain while you are taking this medicine without asking your doctor or health care professional for advice. Some ingredients may increase your blood pressure. What side effects may I notice from receiving this medicine? Side effects that you should report to your doctor or health care professional as soon as possible:  allergic reactions like skin rash, itching or hives, swelling of the face, lips, or tongue  breathing problems  changes in vision  dark urine  eye pain  fast or irregular heart beat, palpitations, or chest pain  feeling faint or lightheaded  muscle cramps  persistent dry  cough  redness, blistering, peeling or loosening of the skin, including inside the mouth   signs and symptoms of high blood sugar such as being more thirsty or hungry or having to urinate more than normal. You may also feel very tired or have blurry vision.  stomach pain  trouble passing urine  unusual bleeding or bruising  worsened gout pain  yellowing of the eyes or skin Side effects that usually do not require medical attention (report to your doctor or health care professional if they continue or are bothersome):  change in sex drive or performance  headache This list may not describe all possible side effects. Call your doctor for medical advice about side effects. You may report side effects to FDA at 1-800-FDA-1088. Where should I keep my medicine? Keep out of the reach of children. Store at room temperature between 15 and 30 degrees C (59 and 86 degrees F). Protect from light. Keep container tightly closed. Throw away any unused medicine after the expiration date. NOTE: This sheet is a summary. It may not cover all possible information. If you have questions about this medicine, talk to your doctor, pharmacist, or health care provider.  2020 Elsevier/Gold Standard (2018-06-13 09:42:12) Hypertension, Adult Hypertension is another name for high blood pressure. High blood pressure forces your heart to work harder to pump blood. This can cause problems over time. There are two numbers in a blood pressure reading. There is a top number (systolic) over a bottom number (diastolic). It is best to have a blood pressure that is below 120/80. Healthy choices can help lower your blood pressure, or you may need medicine to help lower it. What are the causes? The cause of this condition is not known. Some conditions may be related to high blood pressure. What increases the risk?  Smoking.  Having type 2 diabetes mellitus, high cholesterol, or both.  Not getting enough exercise or  physical activity.  Being overweight.  Having too much fat, sugar, calories, or salt (sodium) in your diet.  Drinking too much alcohol.  Having long-term (chronic) kidney disease.  Having a family history of high blood pressure.  Age. Risk increases with age.  Race. You may be at higher risk if you are African American.  Gender. Men are  at higher risk than women before age 57. After age 66, women are at higher risk than men.  Having obstructive sleep apnea.  Stress. What are the signs or symptoms?  High blood pressure may not cause symptoms. Very high blood pressure (hypertensive crisis) may cause: ? Headache. ? Feelings of worry or nervousness (anxiety). ? Shortness of breath. ? Nosebleed. ? A feeling of being sick to your stomach (nausea). ? Throwing up (vomiting). ? Changes in how you see. ? Very bad chest pain. ? Seizures. How is this treated?  This condition is treated by making healthy lifestyle changes, such as: ? Eating healthy foods. ? Exercising more. ? Drinking less alcohol.  Your health care provider may prescribe medicine if lifestyle changes are not enough to get your blood pressure under control, and if: ? Your top number is above 130. ? Your bottom number is above 80.  Your personal target blood pressure may vary. Follow these instructions at home: Eating and drinking   If told, follow the DASH eating plan. To follow this plan: ? Fill one half of your plate at each meal with fruits and vegetables. ? Fill one fourth of your plate at each meal with whole grains. Whole grains include whole-wheat pasta, brown rice, and whole-grain bread. ? Eat or drink low-fat dairy products, such as skim milk or low-fat yogurt. ? Fill one fourth of your plate at each meal with low-fat (lean) proteins. Low-fat proteins include fish, chicken without skin, eggs, beans, and tofu. ? Avoid fatty meat, cured and processed meat, or chicken with skin. ? Avoid pre-made or  processed food.  Eat less than 1,500 mg of salt each day.  Do not drink alcohol if: ? Your doctor tells you not to drink. ? You are pregnant, may be pregnant, or are planning to become pregnant.  If you drink alcohol: ? Limit how much you use to:  0-1 drink a day for women.  0-2 drinks a day for men. ? Be aware of how much alcohol is in your drink. In the [place] one drink equals one 12 oz bottle of beer (355 mL), one 5 oz glass of wine (148 mL), or one 1 oz glass of hard liquor (44 mL). Lifestyle   Work with your doctor to stay at a healthy weight or to lose weight. Ask your doctor what the best weight is for you.  Get at least 30 minutes of exercise most days of the week. This may include walking, swimming, or biking.  Get at least 30 minutes of exercise that strengthens your muscles (resistance exercise) at least 3 days a week. This may include lifting weights or doing Pilates.  Do not use any products that contain nicotine or tobacco, such as cigarettes, e-cigarettes, and chewing tobacco. If you need help quitting, ask your doctor.  Check your blood pressure at home as told by your doctor.  Keep all follow-up visits as told by your doctor. This is important. Medicines  Take over-the-counter and prescription medicines only as told by your doctor. Follow directions carefully.  Do not skip doses of blood pressure medicine. The medicine does not work as well if you skip doses. Skipping doses also puts you at risk for problems.  Ask your doctor about side effects or reactions to medicines that you should watch for. Contact a doctor if you:  Think you are having a reaction to the medicine you are taking.  Have headaches that keep coming back (recurring).  Feel dizzy.  Have swelling in your ankles.  Have trouble with your vision. Get help right away if you:  Get a very bad headache.  Start to feel mixed up (confused).  Feel weak or numb.  Feel faint.  Have very  bad pain in your: ? Chest. ? Belly (abdomen).  Throw up more than once.  Have trouble breathing. Summary  Hypertension is another name for high blood pressure.  High blood pressure forces your heart to work harder to pump blood.  For most people, a normal blood pressure is less than 120/80.  Making healthy choices can help lower blood pressure. If your blood pressure does not get lower with healthy choices, you may need to take medicine. This information is not intended to replace advice given to you by your health care provider. Make sure you discuss any questions you have with your health care provider. Document Released: 02/08/2008 Document Revised: 05/02/2018 Document Reviewed: 05/02/2018 Elsevier Patient Education  2020 [employer].

## 2019-08-22 NOTE — Progress Notes (Addendum)
Patient: Kendra Stewart Female    DOB: [birth year]   38 y.o.   MRN: [id number] Visit Date: 08/22/2019  Today's Provider: Marcille Buffy, FNP   Chief Complaint  Patient presents with  . Hypertension  . Contraception    Patient would like to discuss options  . Hand Pain  right calf pain  Subjective:     Hand Pain  Incident onset: 6 months ago. There was no injury mechanism (patient reports history of trigger finger). The pain is present in the right fingers (middle finger). The pain does not radiate. The patient is experiencing no pain. Pertinent negatives include no chest pain, muscle weakness, numbness or tingling. The symptoms are aggravated by movement. She has tried nothing for the symptoms.    Hypertension, follow-up:  BP Readings from Last 3 Encounters:  08/22/19 (!) 144/102  07/29/19 (!) 132/102  05/16/18 131/84    She was last seen for hypertension 3 weeks ago.  BP at that visit was 132/102. Management changes since that visit include d/c Norvasc, started patient on Losartan-HCTZ . She reports good compliance with treatment. She is not having side effects.  She is exercising. She is adherent to low salt diet.   Outside blood pressures are not being checked . She is experiencing none.  Patient denies chest pain, chest pressure/discomfort, , dyspnea, exertional chest pressure/discomfort, fatigue, irregular heart beat, lower extremity edema, near-syncope, orthopnea, palpitations, paroxysmal nocturnal dyspnea, syncope and tachypnea.  She has exertional shortness of breath. Right lower leg pain/ cramping intermittent swelling. No swelling today. Denies any radiating pain.  Cardiovascular risk factors include hypertension. Family history grandmother with CABG and stroke. No history personal or family of renal disease.  Use of agents associated with hypertension: none.    She does have right calf pain that she has had for " months ".  Denies any vision changes.  She has mild headaches with hypertension. No thunder clap headache or worst headache of her life. Denies any aura.   Denies any swelling today. She is having a lot of caffeine. She is taking Ibuprofen 2- 3 times per weeks.   Right hand 3rd digit, tried ibuprofen. X 6 months. Wakes with digit caught. Wants referral to Dr. Amedeo Plenty. Right hand dominant.   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 She has had Leep procedure in past- 2012.   She needs to schedule eye exam. She has had dental exam.   Contraception prior was Nuva ring. After delivery- she was given progestin only pill.She had hypertension. She would like a referral to gynecology in town.   Weight trend: stable Wt Readings from Last 3 Encounters:  08/22/19 261 lb 6.4 oz (118.6 kg)  07/29/19 255 lb 12.8 oz (116 kg)  05/14/18 214 lb (97.1 kg)    Current diet: well balanced  ------------------------------------------------------------------------  Allergies  Allergen Reactions  . Penicillins Hives    Has patient had a PCN reaction causing immediate rash, facial/tongue/throat swelling, SOB or lightheadedness with hypotension: Unknown Has patient had a PCN reaction causing severe rash involving mucus membranes or skin necrosis: Unknown Has patient had a PCN reaction that required hospitalization: No Has patient had a PCN reaction occurring within the last 10 years: No If all of the above answers are "NO", then may proceed with Cephalosporin use.      Current Outpatient Medications:  .  baclofen (LIORESAL) 10 MG tablet, Take 1 tablet (10 mg  total) by mouth at bedtime as needed for muscle spasms., Disp: 30 each, Rfl: 0 .  butalbital-acetaminophen-caffeine (FIORICET, ESGIC) 50-325-40 MG tablet, Take 1 tablet by mouth 2 (two) times daily as needed for migraine., Disp: , Rfl:  .  losartan-hydrochlorothiazide (HYZAAR) 50-12.5 MG tablet, Take 1 tablet by mouth daily., Disp: 30 tablet, Rfl: 1  Review  of Systems  Constitutional: Positive for fatigue. Negative for activity change, appetite change, chills, diaphoresis, fever and unexpected weight change.  Respiratory: Positive for shortness of breath. Negative for apnea, cough, choking, chest tightness, wheezing and stridor.   Cardiovascular: Positive for leg swelling (right lower calf with pain at times.). Negative for chest pain and palpitations.  Gastrointestinal: Negative.   Genitourinary: Negative for menstrual problem and pelvic pain.       Patient's last menstrual period was 07/29/2019 (exact date).  History of LEEP procedure/ HPV in past pap smears. Last pap smear reported normal as per patient.    Neurological: Negative for tingling and numbness.    Social History   Tobacco Use  . Smoking status: Never Smoker  . Smokeless tobacco: Never Used  Substance Use Topics  . Alcohol use: Not Currently      Objective:   BP (!) 144/102   Pulse 93   Temp (!) 96.9 F (36.1 C) (Oral)   Resp 16   Wt 261 lb 6.4 oz (118.6 kg)   LMP 07/29/2019 (Exact Date)   SpO2 98%   BMI 41.56 kg/m  Vitals:   08/22/19 1026  BP: (!) 144/102  Pulse: 93  Resp: 16  Temp: (!) 96.9 F (36.1 C)  TempSrc: Oral  SpO2: 98%  Weight: 261 lb 6.4 oz (118.6 kg)  Body mass index is 41.56 kg/m. Recheck heart rate 108   Physical Exam Vitals reviewed.  Constitutional:      General: She is not in acute distress.    Appearance: She is well-developed. She is obese. She is not ill-appearing, toxic-appearing or diaphoretic.     Interventions: She is not intubated.    Comments: Patient is alert and oriented and responsive to questions Engages in eye contact with provider. Speaks in full sentences without any pauses without any shortness of breath or distress.    HENT:     Head: Normocephalic and atraumatic.     Nose: Nose normal.     Mouth/Throat:     Mouth: Mucous membranes are moist.     Pharynx: No oropharyngeal exudate or posterior oropharyngeal  erythema.  Eyes:     General: Lids are normal. No scleral icterus.       Right eye: No discharge.        Left eye: No discharge.     Conjunctiva/sclera: Conjunctivae normal.     Right eye: Right conjunctiva is not injected. No exudate or hemorrhage.    Left eye: Left conjunctiva is not injected. No exudate or hemorrhage.    Pupils: Pupils are equal, round, and reactive to light.  Neck:     Thyroid: No thyroid mass or thyromegaly.     Vascular: Normal carotid pulses. No carotid bruit, hepatojugular reflux or JVD.     Trachea: Trachea and phonation normal. No tracheal tenderness or tracheal deviation.     Meningeal: Brudzinski's sign and Kernig's sign absent.  Cardiovascular:     Rate and Rhythm: Regular rhythm.     Pulses: Normal pulses.          Radial pulses are 2+ on the right side and 2+  on the left side.       Dorsalis pedis pulses are 2+ on the right side and 2+ on the left side.       Posterior tibial pulses are 2+ on the right side and 2+ on the left side.     Heart sounds: Normal heart sounds, S1 normal and S2 normal. Heart sounds not distant. No murmur. No friction rub. No gallop.   Pulmonary:     Effort: Pulmonary effort is normal. No tachypnea, bradypnea, accessory muscle usage or respiratory distress. She is not intubated.     Breath sounds: Normal breath sounds. No stridor. No wheezing, rhonchi or rales.     Comments: Shortness of breath with exertion.  Chest:     Chest wall: No tenderness.  Abdominal:     General: Bowel sounds are normal. There is no distension or abdominal bruit.     Palpations: Abdomen is soft. There is no shifting dullness, fluid wave, hepatomegaly, splenomegaly, mass or pulsatile mass.     Tenderness: There is no abdominal tenderness. There is no guarding or rebound.     Hernia: No hernia is present.  Genitourinary:    Comments: deferred will refer to gynecology given history.  Musculoskeletal:        General: Tenderness present. No swelling (few  scattered varicosities present/  no warmth/ positive Homans ), deformity or signs of injury.     Right wrist: Normal.     Left wrist: Normal.     Right hand: Tenderness (tendon sheath tender/ tight middle digit ) present. No swelling, deformity, lacerations or bony tenderness. Normal range of motion. Decreased strength of finger abduction. Normal strength of thumb/finger opposition and wrist extension. Normal sensation. There is no disruption of two-point discrimination. Normal capillary refill. Normal pulse.     Left hand: Normal.     Cervical back: Full passive range of motion without pain, normal range of motion and neck supple. No edema, erythema, rigidity or tenderness. No spinous process tenderness or muscular tenderness. Normal range of motion.     Right lower leg: No edema.     Left lower leg: No edema.  Lymphadenopathy:     Head:     Right side of head: No submental, submandibular, tonsillar, preauricular, posterior auricular or occipital adenopathy.     Left side of head: No submental, submandibular, tonsillar, preauricular, posterior auricular or occipital adenopathy.     Cervical: No cervical adenopathy.     Right cervical: No superficial, deep or posterior cervical adenopathy.    Left cervical: No superficial, deep or posterior cervical adenopathy.     Upper Body:     Right upper body: No supraclavicular or pectoral adenopathy.     Left upper body: No supraclavicular or pectoral adenopathy.  Skin:    General: Skin is warm and dry.     Capillary Refill: Capillary refill takes less than 2 seconds.     Coloration: Skin is not pale.     Findings: No abrasion, bruising, burn, ecchymosis, erythema, lesion, petechiae or rash.     Nails: There is no clubbing.  Neurological:     General: No focal deficit present.     Mental Status: She is alert and oriented to person, place, and time. Mental status is at baseline.     GCS: GCS eye subscore is 4. GCS verbal subscore is 5. GCS motor  subscore is 6.     Cranial Nerves: No cranial nerve deficit.     Sensory: No  sensory deficit.     Motor: No weakness, tremor, atrophy, abnormal muscle tone or seizure activity.     Coordination: Coordination normal.     Gait: Gait normal.     Deep Tendon Reflexes: Reflexes are normal and symmetric. Reflexes normal. Babinski sign absent on the right side. Babinski sign absent on the left side.     Reflex Scores:      Tricep reflexes are 2+ on the right side and 2+ on the left side.      Bicep reflexes are 2+ on the right side and 2+ on the left side.      Brachioradialis reflexes are 2+ on the right side and 2+ on the left side.      Patellar reflexes are 2+ on the right side and 2+ on the left side.      Achilles reflexes are 2+ on the right side and 2+ on the left side. Psychiatric:        Mood and Affect: Mood normal.        Speech: Speech normal.        Behavior: Behavior normal.        Thought Content: Thought content normal.        Judgment: Judgment normal.    Wells Score 7.5    No results found for any visits on 08/22/19.     Assessment & Plan    1. Uncontrolled hypertension Rule out Pulmonary embolism given shortness of breath with exertion and history of calf pain.  Orders Placed This Encounter  Procedures  . CT Angio Chest W/Cm &/Or Wo Cm  . Ambulatory referral to Obstetrics / Gynecology  . Ambulatory referral to Orthopedic Surgery  . Ambulatory referral to Cardiology  . EKG 12-Lead  . VAS [place] LOWER EXTREMITY VENOUS (DVT)   Body mass index is 41.56 kg/m. Obesity. No reported sleep apnea symptoms.  - EKG 12-Lead - Ambulatory referral to Obstetrics / Gynecology - Ambulatory referral to Cardiology - VAS [place] LOWER EXTREMITY VENOUS (DVT); Future  2. Trigger middle finger of right hand Dr. Amanda Pea [facility] for evaluation of trigger finger.  - Ambulatory referral to Orthopedic Surgery  3. Family history of heart disease 1st degree relative with  CABG/Stroke  - Ambulatory referral to Cardiology  4. History of loop electrical excision procedure (LEEP) For pap smear/ pelvic/ and breast exam.  - Ambulatory referral to Obstetrics / Gynecology  5. Encounter for other general counseling or advice on contraception - Ambulatory referral to Obstetrics / Gynecology   6. Shortness of breath- exertional  Ordered stat patient going now at [facility].  WELLS criteria 7.5 Discussed with patient who is in agreement with plan. RED flags and when to seek emergency medical attention.   - CT Angio Chest W/Cm &/Or Wo Cm;  - VAS [place] LOWER EXTREMITY VENOUS (DVT);  7. Pain in right lower leg  - VAS [place] LOWER EXTREMITY VENOUS (DVT); today   8. Routine health maintenance Will get these today were missed at last visit.  Rule out anemia.  - CBC with Differential/Platelet 005009 - VITAMIN D 25 Hydroxy (Vit-D Deficiency, Fractures)    Advised patient call the office or your primary care doctor for an appointment if no improvement within 72 hours or if any symptoms change or worsen at any time  Advised ER or urgent Care if after hours or on weekend. Call 911 for emergency symptoms at any time.Patinet verbalized understanding of all instructions given/reviewed and treatment plan and has no  further questions or concerns at this time.      The entirety of the information documented in the History of Present Illness, Review of Systems and Physical Exam were personally obtained by me. Portions of this information were initially documented by the  Certified Medical Assistant whose name is documented in Epic and reviewed by me for thoroughness and accuracy.  I have personally performed the exam and reviewed the chart and it is accurate to the best of my knowledge.  Museum/gallery conservator has been used and any errors in dictation or transcription are unintentional.  Eula Fried. Flinchum FNP-C  [facility]  Jairo Ben, FNP  [facility]

## 2019-08-22 NOTE — Addendum Note (Signed)
Addended by: Doreen Beam on: 08/22/2019 01:51 PM   Modules accepted: Orders

## 2019-08-22 NOTE — Addendum Note (Signed)
Addended by: Minette Headland on: 08/22/2019 01:46 PM   Modules accepted: Orders

## 2019-08-23 LAB — CBC WITH DIFFERENTIAL/PLATELET
Basophils Absolute: 0 10*3/uL (ref 0.0–0.2)
Basos: 1 %
EOS (ABSOLUTE): 0.6 10*3/uL — ABNORMAL HIGH (ref 0.0–0.4)
Eos: 8 %
Hematocrit: 41.4 % (ref 34.0–46.6)
Hemoglobin: 14.1 g/dL (ref 11.1–15.9)
Immature Grans (Abs): 0 10*3/uL (ref 0.0–0.1)
Immature Granulocytes: 0 %
Lymphocytes Absolute: 2.7 10*3/uL (ref 0.7–3.1)
Lymphs: 32 %
MCH: 29.1 pg (ref 26.6–33.0)
MCHC: 34.1 g/dL (ref 31.5–35.7)
MCV: 86 fL (ref 79–97)
Monocytes Absolute: 0.5 10*3/uL (ref 0.1–0.9)
Monocytes: 6 %
Neutrophils Absolute: 4.7 10*3/uL (ref 1.4–7.0)
Neutrophils: 53 %
Platelets: 284 10*3/uL (ref 150–450)
RBC: 4.84 x10E6/uL (ref 3.77–5.28)
RDW: 12.2 % (ref 11.7–15.4)
WBC: 8.6 10*3/uL (ref 3.4–10.8)

## 2019-08-23 LAB — VITAMIN D 25 HYDROXY (VIT D DEFICIENCY, FRACTURES): Vit D, 25-Hydroxy: 15 ng/mL — ABNORMAL LOW (ref 30.0–100.0)

## 2019-08-23 MED ORDER — VITAMIN D (ERGOCALCIFEROL) 1.25 MG (50000 UNIT) PO CAPS: 50000.0000 [IU] | ORAL_CAPSULE | ORAL | 0 refills | Status: DC

## 2019-08-23 NOTE — Progress Notes (Signed)
Meds ordered this encounter  Medications  . norethindrone (MICRONOR) 0.35 MG tablet    Sig: Take 1 tablet (0.35 mg total) by mouth daily.    Dispense:  1 Package    Refill:  1  . losartan-hydrochlorothiazide (HYZAAR) 100-12.5 MG tablet    Sig: Take 1 tablet by mouth daily.    Dispense:  90 tablet    Refill:  0  . Vitamin D, Ergocalciferol, (DRISDOL) 1.25 MG (50000 UT) CAPS capsule    Sig: Take 1 capsule (50,000 Units total) by mouth every 7 (seven) days. (once weekly for 8 weeks)    Dispense:  8 capsule    Refill:  0   Called patient again on morning of 08/23/19 to review over lab work and answer any further questions regarding her CT scan. DVT venous scan was negative right lower extremity. Shortness of breath thought to be related to obesity, which she is working on. She has had uncontrolled hypertension since pregnancy in 2019, was not getting good response and pressures remained elevated 140's / 100-110's  with Norvasc. Switched to Losartan- HCTZ 50- 12.5 reported good compliance with taking it. Blood pressure still elevated 144/102, has exertional dyspnea with climbing stairs, mild edema at times in ankles.  No chest pain. Increase in Losartan- HCTZ  100-12.5mg  today 08/23/2019. She will try and keep a log of at home readings. She is working on lifestyle modifications weight loss / diet and decreasing sodium.  ( grandmother history of stroke, and CABG - age unsure " was older") Incidental left ventricular thickening on CT angiogram- discussed likely due to uncontrolled hypertension however she is in agreement to see Dr. Fletcher Anon for cardiac evaluation and possible further work up as determined by specialty. Discussed possible Echocardiogram. Will repeat CT chest in 6 months around July 2021. She will report any increase or change in symptoms. She is low risk for malignancy, denies ever smoking or second hand smoke, no known environmental exposures.  12 mm sclerotic lesion in the inferior aspect  of the T8 vertebral body. This could represent a bone island. Likely bony growth will recheck with follow up scan in 6 months. Patient will report any new or changing symptoms.   Vitamin D deficient - sent in script as above. Will start Vitamin D 3 prescription as prescribed and then start Vitamin D 3 at 4,000 IU daily after prescription vitamin d is completed.  Recheck in 3- 4 months- lab.  CBC - no anemia. eosinophils mild increase- discussed rational. CMP - electrolytes normal, kidney, liver function normal. B12 normal.  Thyroid normal  LDL - elevated 128- lifestyle modifications/ diet and exercise and recheck 6 months.   Patient verbalized understanding of all instructions given and denies any further questions at this time. She is aware she can call the office or message me on Mychart .   08/28/19 cardiology appointment.  Follow up with me in one  - around 09/25/19.    Study Result  CLINICAL DATA:  New shortness of breath.  EXAM: CT ANGIOGRAPHY CHEST WITH CONTRAST  TECHNIQUE: Multidetector CT imaging of the chest was performed using the standard protocol during bolus administration of intravenous contrast. Multiplanar CT image reconstructions and MIPs were obtained to evaluate the vascular anatomy.  CONTRAST:  28mL OMNIPAQUE IOHEXOL 350 MG/ML SOLN  COMPARISON:  None.  FINDINGS: Cardiovascular: Satisfactory opacification of the pulmonary arteries to the segmental level. No evidence of pulmonary embolism. Normal heart size. No pericardial effusion. There is thickening of the wall of  the left ventricle.  Mediastinum/Nodes: No enlarged mediastinal, hilar, or axillary lymph nodes. Thyroid gland, trachea, and esophagus demonstrate no significant findings.  Lungs/Pleura: There is a 3 mm nodule in the right lower lobe on image 66. There are 3 mm nodules in the left lung on image 25 image 80 and image 91, all on series 8. The lungs are otherwise clear. No pleural  effusions.  Upper Abdomen: Hepatic steatosis.  Musculoskeletal: 12 mm sclerotic lesion in the inferior aspect of the T8 vertebral body. The bones otherwise appear normal.  Review of the MIP images confirms the above findings.  IMPRESSION: 1. No pulmonary emboli or other acute abnormalities. 2. Thickening of the wall of the left ventricle. 3. 12 mm sclerotic lesion in the inferior aspect of the T8 vertebral body. This could represent a bone island. However, the possibility of a sclerotic metastatic lesion cannot be completely excluded but the patient had gives no history of malignancy so this is unlikely. 4. Small bilateral pulmonary nodules. If the patient is at low risk for lung cancer, no further follow-up is recommended. If the patient is at high risk for lung cancer, follow-up CT scan in 12 months is recommended. If the patient is at high risk for lung cancer, follow-up CT scan in 6-12 months is recommended. 5. Hepatic steatosis.   Electronically Signed   By: Francene Boyers M.D.   On: 08/22/2019 16:25    No DVT on right lower venous ultrasound.

## 2019-08-23 NOTE — Progress Notes (Signed)
Discussed with patient, answered all questions. Report any new or changing symptoms or concerns to me. Repeat scan around 02/2020.  Patient verbalized understanding of all instructions given and denies any further questions at this time.

## 2019-08-23 NOTE — Addendum Note (Signed)
Addended by: Doreen Beam on: 08/23/2019 08:25 AM   Modules accepted: Orders

## 2019-08-28 ENCOUNTER — Other Ambulatory Visit: Payer: Self-pay

## 2019-08-28 ENCOUNTER — Ambulatory Visit (INDEPENDENT_AMBULATORY_CARE_PROVIDER_SITE_OTHER): Payer: BC Managed Care – PPO | Admitting: Cardiovascular Disease

## 2019-08-28 ENCOUNTER — Encounter: Payer: Self-pay | Admitting: Cardiovascular Disease

## 2019-08-28 VITALS — BP 130/98 | HR 114 | Ht 66.5 in | Wt 258.0 lb

## 2019-08-28 DIAGNOSIS — I10 Essential (primary) hypertension: Secondary | ICD-10-CM | POA: Diagnosis not present

## 2019-08-28 DIAGNOSIS — R0602 Shortness of breath: Secondary | ICD-10-CM | POA: Diagnosis not present

## 2019-08-28 NOTE — Progress Notes (Signed)
Cardiology Office Note   Date:  08/28/2019   ID:  Adonis Ryther, DOB 1981/02/17, MRN [id number]  PCP:  Berniece Pap, FNP  Cardiologist:   Lorine Bears, MD   Chief Complaint  Patient presents with  . other    Ref by Marvell Fuller FNP for uncontrolled HTN & follow up of CT scan. Meds reviewed by the pt. verbally.       History of Present Illness: Kendra Stewart is a 38 y.o. female who was referred by Marvell Fuller for evaluation of uncontrolled hypertension and family history of coronary artery disease.  The patient has no prior cardiac history.  She developed elevated blood pressure last year during her pregnancy and she was induced at 37 weeks.  Her blood pressure did not come down after delivery and she was placed on antihypertensive medications.  In addition, she had gestational diabetes.  No other significant health issues other than obesity.  She is not a smoker.  She does drink 3 cups of coffee daily.  She denies any chest pain but she does complain of significant exertional dyspnea.  Due to her dyspnea and some swelling in the right calf, she was sent for CTA of the chest which showed no evidence of pulmonary embolism.  There was thickening of the left ventricle.  Venous Doppler was negative for DVT. She does have strong family history of hypertension at early age.  Also her grandmother had CABG.  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  Her blood pressure seems to be consistently high and most recently losartan-hydrochlorothiazide was increased last week.  Since then, she reports improvement in her headache.    Past Medical History:  Diagnosis Date  . Anemia   . Anxiety   . Gestational diabetes   . Pregnancy induced hypertension    early in pregnancy    Past Surgical History:  Procedure Laterality Date  . BREAST REDUCTION SURGERY    . BREAST SURGERY    . TONSILLECTOMY       Current Outpatient  Medications  Medication Sig Dispense Refill  . baclofen (LIORESAL) 10 MG tablet Take 1 tablet (10 mg total) by mouth at bedtime as needed for muscle spasms. 30 each 0  . losartan-hydrochlorothiazide (HYZAAR) 100-12.5 MG tablet Take 1 tablet by mouth daily. 90 tablet 0  . norethindrone (MICRONOR) 0.35 MG tablet Take 1 tablet (0.35 mg total) by mouth daily. 1 Package 1  . Vitamin D, Ergocalciferol, (DRISDOL) 1.25 MG (50000 UT) CAPS capsule Take 1 capsule (50,000 Units total) by mouth every 7 (seven) days. (once weekly for 8 weeks) 8 capsule 0   No current facility-administered medications for this visit.    Allergies:   Penicillins    Social History:  The patient  reports that she has never smoked. She has never used smokeless tobacco. She reports previous alcohol use. She reports that she does not use drugs.   Family History:  The patient's family history includes Anxiety disorder in her mother; Depression in her mother; Hyperlipidemia in her father and mother; Hypertension in her father and mother.    ROS:  Please see the history of present illness.   Otherwise, review of systems are positive for none.   All other systems are reviewed and negative.    PHYSICAL EXAM: VS:  BP (!) 130/98 (BP Location: Right Arm, Patient Position: Sitting, Cuff Size: Large)   Pulse (!) 114   Ht 5'  6.5" (1.689 m)   Wt 258 lb (117 kg)   LMP 07/29/2019 (Exact Date)   SpO2 99%   BMI 41.02 kg/m  , BMI Body mass index is 41.02 kg/m. GEN: Well nourished, well developed, in no acute distress  HEENT: normal  Neck: no JVD, carotid bruits, or masses Cardiac: RRR; no murmurs, rubs, or gallops,no edema  Respiratory:  clear to auscultation bilaterally, normal work of breathing GI: soft, nontender, nondistended, + BS MS: no deformity or atrophy  Skin: warm and dry, no rash Neuro:  Strength and sensation are intact Psych: euthymic mood, full affect   EKG:  EKG is ordered today. The ekg ordered today  demonstrates sinus tachycardia with possible left atrial enlargement, poor R wave progression in the anterior leads suggestive of anteroseptal infarct.   Recent Labs: 07/29/2019: ALT 23; BUN 9; Creatinine, Ser 0.59; Potassium 4.4; Sodium 135; TSH 1.380 08/22/2019: Hemoglobin 14.1; Platelets 284    Lipid Panel    Component Value Date/Time   CHOL 197 07/29/2019 1032   TRIG 125 07/29/2019 1032   HDL 47 07/29/2019 1032   LDLCALC 128 (H) 07/29/2019 1032      Wt Readings from Last 3 Encounters:  08/28/19 258 lb (117 kg)  08/22/19 261 lb 6.4 oz (118.6 kg)  07/29/19 255 lb 12.8 oz (116 kg)       PAD Screen 08/28/2019  Previous PAD dx? No  Previous surgical procedure? No  Pain with walking? No  Feet/toe relief with dangling? No  Painful, non-healing ulcers? No  Extremities discolored? No      ASSESSMENT AND PLAN:  1.  Hypertension:in pregnancy-induced hypertension, it is expected for blood pressure to improve after delivery but that did not happen.  She does have strong family history of hypertension at early age and thus it is possible that she might have essential hypertension.  Nonetheless, given her young age and difficulty in controlling blood pressure in spite of 2 medications, I think we have to exclude secondary hypertension.  I requested renal artery duplex to evaluate for possible renal artery stenosis related to fibromuscular dysplasia.  I also requested renin to aldosterone ratio. She does not have symptoms suggestive of pheochromocytoma. If blood pressure continues to be elevated, we can consider adding carvedilol especially with intermittent tachycardia.  2.  Exertional dyspnea: There could be an element of physical deconditioning but her EKG is abnormal and also CT showed thickening of the left ventricle.  Thus, I requested an echocardiogram for evaluation.  If symptoms persist, she might require ischemic cardiac evaluation once blood pressure is  controlled.    Disposition:   FU after above testing.  Signed,  Kathlyn Sacramento, MD  08/28/2019 3:42 PM    [facility]

## 2019-08-28 NOTE — Patient Instructions (Signed)
Medication Instructions:  - Your physician recommends that you continue on your current medications as directed. Please refer to the Current Medication list given to you today.  *If you need a refill on your cardiac medications before your next appointment, please call your pharmacy*  Lab Work: - Your physician recommends that you have lab work: the day of your in office testing- Renin-Aldosterone Level (can have done in clinic)   If you have labs (blood work) drawn today and your tests are completely normal, you will receive your results only by: Marland Kitchen MyChart Message (if you have MyChart) OR . A paper copy in the mail If you have any lab test that is abnormal or we need to change your treatment, we will call you to review the results.  Testing/Procedures: - Your physician has requested that you have a renal artery duplex. During this test, an ultrasound is used to evaluate blood flow to the kidneys. Allow one hour for this exam. Do not eat after midnight the day before and avoid carbonated beverages. Take your medications as you usually do.  - Your physician has requested that you have an echocardiogram. Echocardiography is a painless test that uses sound waves to create images of your heart. It provides your doctor with information about the size and shape of your heart and how well your heart's chambers and valves are working. This procedure takes approximately one hour. There are no restrictions for this procedure.    Follow-Up: At [facility], you and your health needs are our priority.  As part of our continuing mission to provide you with exceptional heart care, we have created designated Provider Care Teams.  These Care Teams include your primary Cardiologist (physician) and Advanced Practice Providers (APPs -  Physician Assistants and Nurse Practitioners) who all work together to provide you with the care you need, when you need it.  Your next appointment:   After your testing is  complete   The format for your next appointment:   In Person  Provider:    You may see  or one of the following Advanced Practice Providers on your designated Care Team:    Murray Hodgkins, NP  Christell Faith, PA-C  Marrianne Mood, PA-C   Other Instructions n/a   Renal Scan  A renal scan is a procedure that is used to check for problems in the kidneys. The kidneys are the organs that help filter blood and keep it clean. They move waste out of your blood and into your urine so it can be removed from the body. In this procedure, a small amount of radioactive material (tracer) is injected into your blood. The tracer will travel through your bloodstream and reach your kidneys. A scanner with a camera that detects the radioactive tracer is used to examine the kidneys. The tracer makes it easy for your health care provider to see problems, if there are any. There are several types of renal scans:  Renal structural scan. This type is used to check for problems that may change the normal structure of the kidney. These problems include cysts, tumors, abscesses, and certain disorders that are present at birth (congenital).  Renal function scan. This type is used to check for problems that affect kidney function, such as inflammation, low blood supply, or kidney failure.  Renal blood flow scan. This type is used to evaluate the blood flow to each kidney. It can help to identify any narrowing of the arteries that carry blood to the kidneys (  renal artery stenosis).  Renal hypertension scan. This type is used to identify the cause of high blood pressure that results from problems in the renal arteries (renovascular hypertension). It can help to identify any narrowing or blockages in these arteries.  Renal obstruction scan. This type is used to identify any obstruction in the area where waste moves out of the kidney and into the urinary tract. Tell a health care provider about:  Any allergies  you have.  All medicines you are taking, including vitamins, herbs, eye drops, creams, and over-the-counter medicines.  Any blood disorders you have.  Any surgeries you have had.  Any medical conditions you have.  If you are breastfeeding.  If you are pregnant or you think that you may be pregnant. What are the risks? Generally, this is a safe procedure. However, problems may occur, including:  Exposure to radiation (a small amount).  Allergic reaction to the radioactive material. This is rare. What happens before the procedure?  You may be asked to drink extra fluids for 24 hours before the exam. Follow your health care provider's instructions.  Ask your health care provider about: ? Changing or stopping your regular medicines. This is especially important if you are taking diabetes medicines or blood thinners. ? Taking medicines such as aspirin and ibuprofen. These medicines can thin your blood and affect kidney function. Do not take these medicines unless your health care provider tells you to take them. ? Taking over-the-counter medicines, vitamins, herbs, and supplements. What happens during the procedure?  You will lie on a scanner table.  An IV line will be inserted into one of your veins. The IV line will remain in place for the entire exam.  A small amount of radioactive tracer will be injected through the IV line.  Images will be taken of your kidney area. The images will be taken at different intervals depending on what is being checked.  In some cases, a second type of tracer or a medicine may be injected. Then, more scans of the kidney will be done. The procedure may vary among health care providers and hospitals. What happens after the procedure?  Return to your normal activities and your normal diet as directed by your health care provider.  The radioactive tracer will leave your body over the next few days. Drink enough fluid to keep your urine pale yellow.  This will help flush the tracer out of your body.  Ask your health care provider, or the department that is doing the test: ? When will my results be ready? ? How will I get my results? ? What are my treatment options? ? What other tests do I need? ? What are my next steps?  Talk with your health care provider about what your results mean. Summary  A renal scan is a procedure that is used to check for problems in the kidneys. The kidneys are the organs that help filter blood and keep it clean.  A renal scan can help check blood flow to the kidney and help look for cysts, tumors, or infection in the kidneys.  Tell your health care provider about any prescription or over-the-counter medicines, vitamins, herbs, and supplements that you are taking. Some of these can affect kidney function and may interfere with your test results.  Talk with your health care provider about what your results mean. This information is not intended to replace advice given to you by your health care provider. Make sure you discuss any questions  you have with your health care provider. Document Released: 08/19/2000 Document Revised: 09/29/2017 Document Reviewed: 09/29/2017 Elsevier Patient Education  2020 [employer]   Echocardiogram An echocardiogram is a procedure that uses painless sound waves (ultrasound) to produce an image of the heart. Images from an echocardiogram can provide important information about:  Signs of coronary artery disease (CAD).  Aneurysm detection. An aneurysm is a weak or damaged part of an artery wall that bulges out from the normal force of blood pumping through the body.  Heart size and shape. Changes in the size or shape of the heart can be associated with certain conditions, including heart failure, aneurysm, and CAD.  Heart muscle function.  Heart valve function.  Signs of a past heart attack.  Fluid buildup around the heart.  Thickening of the heart muscle.  A  tumor or infectious growth around the heart valves. Tell a health care provider about:  Any allergies you have.  All medicines you are taking, including vitamins, herbs, eye drops, creams, and over-the-counter medicines.  Any blood disorders you have.  Any surgeries you have had.  Any medical conditions you have.  Whether you are pregnant or may be pregnant. What are the risks? Generally, this is a safe procedure. However, problems may occur, including:  Allergic reaction to dye (contrast) that may be used during the procedure. What happens before the procedure? No specific preparation is needed. You may eat and drink normally. What happens during the procedure?   An IV tube may be inserted into one of your veins.  You may receive contrast through this tube. A contrast is an injection that improves the quality of the pictures from your heart.  A gel will be applied to your chest.  A wand-like tool (transducer) will be moved over your chest. The gel will help to transmit the sound waves from the transducer.  The sound waves will harmlessly bounce off of your heart to allow the heart images to be captured in real-time motion. The images will be recorded on a computer. The procedure may vary among health care providers and hospitals. What happens after the procedure?  You may return to your normal, everyday life, including diet, activities, and medicines, unless your health care provider tells you not to do that. Summary  An echocardiogram is a procedure that uses painless sound waves (ultrasound) to produce an image of the heart.  Images from an echocardiogram can provide important information about the size and shape of your heart, heart muscle function, heart valve function, and fluid buildup around your heart.  You do not need to do anything to prepare before this procedure. You may eat and drink normally.  After the echocardiogram is completed, you may return to your  normal, everyday life, unless your health care provider tells you not to do that. This information is not intended to replace advice given to you by your health care provider. Make sure you discuss any questions you have with your health care provider. Document Released: 08/19/2000 Document Revised: 12/13/2018 Document Reviewed: 09/24/2016 Elsevier Patient Education  2020 [employer]

## 2019-08-29 ENCOUNTER — Telehealth: Payer: Self-pay

## 2019-08-29 NOTE — Telephone Encounter (Signed)
Copied from [place] [id number]. Topic: General - Call Back - No Documentation >> Aug 28, 2019  5:22 PM Erick Blinks wrote: Reason for CRM: Pt is requesting for a VM to be left, and she states that her VM box is not full. Best contact: [phone]

## 2019-09-02 NOTE — Telephone Encounter (Signed)
Nunzio Cory,  I did try calling patient just to follow up with her after her cardiology visit. Was just a follow up call.

## 2019-09-02 NOTE — Telephone Encounter (Signed)
I dont understand what this message means? I do not see a previous telephone encounter. KW

## 2019-09-03 ENCOUNTER — Ambulatory Visit (INDEPENDENT_AMBULATORY_CARE_PROVIDER_SITE_OTHER): Payer: BC Managed Care – PPO

## 2019-09-03 ENCOUNTER — Other Ambulatory Visit: Payer: Self-pay

## 2019-09-03 ENCOUNTER — Other Ambulatory Visit (INDEPENDENT_AMBULATORY_CARE_PROVIDER_SITE_OTHER): Payer: BC Managed Care – PPO

## 2019-09-03 DIAGNOSIS — I10 Essential (primary) hypertension: Secondary | ICD-10-CM

## 2019-09-03 DIAGNOSIS — R0602 Shortness of breath: Secondary | ICD-10-CM

## 2019-09-03 MED ORDER — PERFLUTREN LIPID MICROSPHERE
1.0000 mL | INTRAVENOUS | Status: AC | PRN
  Administered 2019-09-03: 2 mL via INTRAVENOUS

## 2019-09-03 NOTE — Telephone Encounter (Signed)
Patient wanted to let you know that cardiologist ordered a ultrasound of her heart which is scheduled today and she states that she has another ultrasound scheduled on Thursday for her kidneys. Patient reports that cardiologist lso ordered labs to check for tumor of the adrenal gland. KW

## 2019-09-05 ENCOUNTER — Other Ambulatory Visit: Payer: Self-pay

## 2019-09-05 ENCOUNTER — Ambulatory Visit (INDEPENDENT_AMBULATORY_CARE_PROVIDER_SITE_OTHER): Payer: BC Managed Care – PPO

## 2019-09-05 DIAGNOSIS — M65331 Trigger finger, right middle finger: Secondary | ICD-10-CM | POA: Diagnosis not present

## 2019-09-05 DIAGNOSIS — I10 Essential (primary) hypertension: Secondary | ICD-10-CM

## 2019-09-12 ENCOUNTER — Encounter (INDEPENDENT_AMBULATORY_CARE_PROVIDER_SITE_OTHER): Payer: BC Managed Care – PPO

## 2019-09-12 ENCOUNTER — Encounter (INDEPENDENT_AMBULATORY_CARE_PROVIDER_SITE_OTHER): Payer: BC Managed Care – PPO | Admitting: Nurse Practitioner

## 2019-09-18 ENCOUNTER — Encounter (INDEPENDENT_AMBULATORY_CARE_PROVIDER_SITE_OTHER): Payer: BC Managed Care – PPO

## 2019-09-18 ENCOUNTER — Encounter (INDEPENDENT_AMBULATORY_CARE_PROVIDER_SITE_OTHER): Payer: BC Managed Care – PPO | Admitting: Nurse Practitioner

## 2019-09-24 ENCOUNTER — Other Ambulatory Visit: Payer: Self-pay | Admitting: Surgery

## 2019-09-24 ENCOUNTER — Other Ambulatory Visit (HOSPITAL_COMMUNITY): Payer: Self-pay | Admitting: Surgery

## 2019-10-02 ENCOUNTER — Ambulatory Visit (HOSPITAL_COMMUNITY): Payer: BC Managed Care – PPO

## 2019-10-02 DIAGNOSIS — U071 COVID-19: Secondary | ICD-10-CM | POA: Diagnosis not present

## 2019-10-02 DIAGNOSIS — Z03818 Encounter for observation for suspected exposure to other biological agents ruled out: Secondary | ICD-10-CM | POA: Diagnosis not present

## 2019-10-02 DIAGNOSIS — Z20828 Contact with and (suspected) exposure to other viral communicable diseases: Secondary | ICD-10-CM | POA: Diagnosis not present

## 2019-10-03 ENCOUNTER — Other Ambulatory Visit (HOSPITAL_COMMUNITY): Payer: BC Managed Care – PPO

## 2019-10-09 ENCOUNTER — Telehealth: Payer: Self-pay | Admitting: Cardiovascular Disease

## 2019-10-09 NOTE — Telephone Encounter (Signed)
Spoke with the patient and reviewed her recent test results. Patient questions answered. Patient was to have a f/u appt after testing that was not scheduled. Appt scheduled with Gillian Shields, NP on 10/18/19 @ 3pm. Patient is aware of the appt date, time and location. Advised the pt to bring her BP log with her to her upcoming appt. Patient voiced appreciation for the call back.

## 2019-10-09 NOTE — Telephone Encounter (Signed)
Patient calling States that she had a few tests completed after seeing Dr Kirke Corin in Dec Patient has seen results on mychart but has not received a phone call explaining Would like to know what the next steps should be Please call to discuss

## 2019-10-17 ENCOUNTER — Encounter: Payer: Self-pay | Admitting: Dietician

## 2019-10-17 ENCOUNTER — Encounter: Payer: BC Managed Care – PPO | Attending: Surgery | Admitting: Dietician

## 2019-10-17 ENCOUNTER — Other Ambulatory Visit: Payer: Self-pay

## 2019-10-17 VITALS — Ht 67.0 in | Wt 260.2 lb

## 2019-10-17 DIAGNOSIS — Z6841 Body Mass Index (BMI) 40.0 and over, adult: Secondary | ICD-10-CM

## 2019-10-17 DIAGNOSIS — F509 Eating disorder, unspecified: Secondary | ICD-10-CM | POA: Diagnosis not present

## 2019-10-17 NOTE — Progress Notes (Signed)
Nutrition Assessment  Proposed Surgery: sleeve gastrectomy   Height: 5'7" Weight: 260.2lbs BMI: 40.75 Upper IBW% (UIBW): 175% (UIBW 149lbs)  Patient's Goal Weight: 150lbs  Medical History: HTN, history of gestational diabetes and hyperglycemia Medications and Supplements: baclofen, losartan-hydrochlorothiazide, norethindrone, Vitamin D, multivitamin (viviscal)  Previous surgeries: tonsillectomy (childhood); breast reduction 2005 Drug allergies: pcn Food allergies: none known Alcohol use: none Tobacco use: none  Physical activity: none at this time; sedentary job working from home  Weight history: Childhood: slightly overweight    Adolescence: slightly overweight    Adulthood: overweight; significant weight gain since pregnancy and childbirth 18 months ago.    Weight 1 year ago: 260lbs  Dieting/ weight loss history:   Patient has been contemplating bariatric surgery for about 1 year, after having tried multiple diets in the past with limited and short-term success.   She did best with lower carb diets.  She has a family member who has had bariatric surgery in the past, and provides some support for patient. Kendra Stewart and parents are also supportive.  Dietary Recall:  Daily pattern: 2-3 meals and 1-2 snacks. Dining out: 2-3 meals per week. Breakfast: eggs, toast; sometimes skips if not hungry Lunch: sandwich; takeout; leftovers Supper: usu cooks at home -- chicken + potato and veg or similar Snack(s): string cheese, crackers; potato chips if craving Beverages: water, coffee, diet coke  Psychosocial: Emotional eating history: none significant Disordered eating history: none   Intervention:  Patient has researched this procedure by personal online research, reading books, consulting with family member.   Instructed her on pre-op diet guidelines and goal, including liver reduction diet.   Discussed stages of the bariatric diet after surgery as well as the importance of  adequate protein and fluid intake.   Discussed possible side effects and complications after surgery, and importance of following diet closely to reduce risk.   Discussed healthy mindset with food/ eating, directing thoughts away from food when needed, and dealing with difficult situations such as holidays and family gatherings.  Summary:  Patient is ready to begin making lifestyle changes in effort to lose weight and prepare for bariatric surgery.  She has solid support from spouse and parents.   She agrees to work on slower eating/ more thorough chewing, as well as limiting mealtime fluids, prior to surgery.   She is motivated to follow the bariatric diet after surgery. From a nutrition standpoint, she is ready to proceed with the bariatric surgery program.    Plan:  Patient commits to returning for  Pre-op class prior to surgery.   She will plan to return for post-op RD visits beginning 2 weeks after surgery.

## 2019-10-17 NOTE — Patient Instructions (Addendum)
   Practice chewing foods more thoroughly when eating.   Gradually reduce the amount of fluid intake during meals, by keeping the glass away from the table, or pouring less into your glass.   Investigate options for protein drinks that are high in protein (15g+) and low in carbs (5g or less)

## 2019-10-18 ENCOUNTER — Encounter: Payer: Self-pay | Admitting: Family

## 2019-10-18 ENCOUNTER — Other Ambulatory Visit: Payer: Self-pay

## 2019-10-18 ENCOUNTER — Ambulatory Visit (INDEPENDENT_AMBULATORY_CARE_PROVIDER_SITE_OTHER): Payer: BC Managed Care – PPO | Admitting: Family

## 2019-10-18 ENCOUNTER — Ambulatory Visit (HOSPITAL_COMMUNITY)
Admission: RE | Admit: 2019-10-18 | Discharge: 2019-10-18 | Disposition: A | Discharge status: Home / Self Care | Payer: BC Managed Care – PPO | Source: Ambulatory Visit | Attending: Surgery | Admitting: Surgery

## 2019-10-18 VITALS — BP 130/98 | HR 105 | Ht 67.0 in | Wt 264.8 lb

## 2019-10-18 DIAGNOSIS — R0600 Dyspnea, unspecified: Secondary | ICD-10-CM | POA: Diagnosis not present

## 2019-10-18 DIAGNOSIS — E669 Obesity, unspecified: Secondary | ICD-10-CM | POA: Diagnosis not present

## 2019-10-18 DIAGNOSIS — I10 Essential (primary) hypertension: Secondary | ICD-10-CM | POA: Diagnosis not present

## 2019-10-18 DIAGNOSIS — R0609 Other forms of dyspnea: Secondary | ICD-10-CM

## 2019-10-18 DIAGNOSIS — Z01818 Encounter for other preprocedural examination: Secondary | ICD-10-CM | POA: Diagnosis not present

## 2019-10-18 MED ORDER — CARVEDILOL 6.25 MG PO TABS: 6.2500 mg | ORAL_TABLET | Freq: Two times a day (BID) | ORAL | 2 refills | Status: DC

## 2019-10-18 NOTE — Progress Notes (Signed)
Office Visit    Patient Name: Kendra Stewart Date of Encounter: 10/18/2019  Primary Care Provider:  Berniece Pap, FNP Primary Cardiologist:  Lorine Bears, MD Electrophysiologist:  None   Chief Complaint    Kendra Stewart is a 39 y.o. female with a hx of HTN, family hx of CAD, gestational diabetes, obesity presents today for follow up after testing   Past Medical History    Past Medical History:  Diagnosis Date  . Anemia   . Anxiety   . Gestational diabetes   . Pregnancy induced hypertension    early in pregnancy   Past Surgical History:  Procedure Laterality Date  . BREAST REDUCTION SURGERY    . BREAST SURGERY    . TONSILLECTOMY      Allergies  Allergies  Allergen Reactions  . Penicillins Hives    Has patient had a PCN reaction causing immediate rash, facial/tongue/throat swelling, SOB or lightheadedness with hypotension: Unknown Has patient had a PCN reaction causing severe rash involving mucus membranes or skin necrosis: Unknown Has patient had a PCN reaction that required hospitalization: No Has patient had a PCN reaction occurring within the last 10 years: No If all of the above answers are "NO", then may proceed with Cephalosporin use.     History of Present Illness    Kendra Stewart is a 39 y.o. female with a hx of HTN, family hx of CAD, gestational diabetes, obesity last seen by Dr. Kirke Corin 08/28/19.  She was initially referred for evaluation of uncontrolled hypertension family history of coronary artery disease.  She had elevated blood pressure during her pregnancy in 2019 and was subsequently induced at 37 weeks.  Blood pressure did not come down after delivery and she was placed on antihypertensive medications.  Family history of hypertension early age.  Her grandmother had CABG.  Previous cardiac workup includes LE duplex 08/22/19 negative for DVT in setting of R>L LE edema. CTA chest for SOB with no evidence of PE but did describe thickening of LV.  Subsequent echocardiogram 09/03/19 with LVEF 60-65%, no LVH, RV nromal size and function, no significant valvular abnormalities. Aldosterone/renin ratio 09/03/19 normal. Renal duplex 09/05/19 with no evidence of renal artery stenosis.  Very pleasant lady who reports feeling overall well.  She has an 85-month-old daughter at home.  She works from home.  Checks blood pressure routinely tells me it is 120s in the morning then throughout the day it gets up into the 130s over 80s.  Her echocardiogram and renal duplex were reviewed in depth during the office visit.   Walking up the stairs gives her dyspnea. Lasts a couple seconds and self resolves. Tells me it is exacerbated after a meal.  Reports it is no better, no worse than previous. Does live on a second floor apartment and has to walk stairs regularly. No chest pain, pressure, tightness.   Reports she intermittently notices her heart racing. Tells me it just feels like a fast beat.  Undergoing work-up for bariatric surgery.  She had her psychology and nutrition consult yesterday.  She had swallow study today.  She has a repeat psychology and nutrition appointment upcoming.   EKGs/Labs/Other Studies Reviewed:   The following studies were reviewed today:  Renal Duplex 09/05/19 Right: Normal size right kidney. Normal cortical thickness of right         kidney. No evidence of right renal artery stenosis.  Left:  Normal size of left kidney. Normal cortical thickness of the  left kidney. No evidence of left renal artery stenosis.  Mesenteric:  Normal Celiac artery and Superior Mesenteric artery findings.   Echocardiogram 09/03/19  1. Left ventricular ejection fraction, by visual estimation, is 60 to  65%. The left ventricle has normal function. There is no left ventricular  hypertrophy.   2. The left ventricle has no regional wall motion abnormalities.   3. Global right ventricle has normal systolic function.The right  ventricular size  is normal. No increase in right ventricular wall  thickness.   4. Left atrial size was normal.   5. Right atrial size was normal.   6. The mitral valve was not well visualized. Trivial mitral valve  regurgitation. No evidence of mitral stenosis.   7. The tricuspid valve is not well visualized.   8. The aortic valve was not well visualized. Aortic valve regurgitation  is not visualized. No evidence of aortic valve sclerosis or stenosis.   9. The pulmonic valve was not well visualized. Pulmonic valve  regurgitation is trivial.  10. TR signal is inadequate for assessing pulmonary artery systolic  pressure.  11. The inferior vena cava is normal in size with greater than 50%  respiratory variability, suggesting right atrial pressure of 3 mmHg.  12. The interatrial septum was not well visualized.   CT Angio Chest 08/22/19 IMPRESSION: 1. No pulmonary emboli or other acute abnormalities. 2. Thickening of the wall of the left ventricle. 3. 12 mm sclerotic lesion in the inferior aspect of the T8 vertebral body. This could represent a bone island. However, the possibility of a sclerotic metastatic lesion cannot be completely excluded but the patient had gives no history of malignancy so this is unlikely. 4. Small bilateral pulmonary nodules. If the patient is at low risk for lung cancer, no further follow-up is recommended. If the patient is at high risk for lung cancer, follow-up CT scan in 12 months is recommended. If the patient is at high risk for lung cancer, follow-up CT scan in 6-12 months is recommended. 5. Hepatic steatosis.  Recent Labs: 07/29/2019: ALT 23; BUN 9; Creatinine, Ser 0.59; Potassium 4.4; Sodium 135; TSH 1.380 08/22/2019: Hemoglobin 14.1; Platelets 284  Recent Lipid Panel    Component Value Date/Time   CHOL 197 07/29/2019 1032   TRIG 125 07/29/2019 1032   HDL 47 07/29/2019 1032   LDLCALC 128 (H) 07/29/2019 1032    Home Medications   Current Meds  Medication  Sig  . baclofen (LIORESAL) 10 MG tablet Take 1 tablet (10 mg total) by mouth at bedtime as needed for muscle spasms.  Marland Kitchen losartan-hydrochlorothiazide (HYZAAR) 100-12.5 MG tablet Take 1 tablet by mouth daily.  . Multiple Vitamin (MULTIVITAMIN ADULT PO) Take by mouth. viviscal  . norethindrone (MICRONOR) 0.35 MG tablet Take 1 tablet (0.35 mg total) by mouth daily.  . Vitamin D, Ergocalciferol, (DRISDOL) 1.25 MG (50000 UT) CAPS capsule Take 1 capsule (50,000 Units total) by mouth every 7 (seven) days. (once weekly for 8 weeks)    Review of Systems      Review of Systems  Constitution: Negative for chills, fever and malaise/fatigue.  Cardiovascular: Positive for dyspnea on exertion. Negative for chest pain, leg swelling, near-syncope, orthopnea, palpitations and syncope.  Respiratory: Negative for cough, shortness of breath and wheezing.   Gastrointestinal: Negative for nausea and vomiting.  Neurological: Negative for dizziness, light-headedness and weakness.   All other systems reviewed and are otherwise negative except as noted above.  Physical Exam    VS:  BP Marland Kitchen)  130/98 (BP Location: Left Arm, Patient Position: Sitting, Cuff Size: Large)   Pulse (!) 105   Ht 5\' 7"  (1.702 m)   Wt 264 lb 12 oz (120.1 kg)   LMP 10/11/2019   SpO2 98%   BMI 41.47 kg/m  , BMI Body mass index is 41.47 kg/m. GEN: Well nourished, overweight,  well developed, in no acute distress. HEENT: normal. Neck: Supple, no JVD, carotid bruits, or masses. Cardiac: RRR, no murmurs, rubs, or gallops. No clubbing, cyanosis, edema.  Radials/DP/PT 2+ and equal bilaterally.  Respiratory:  Respirations regular and unlabored, clear to auscultation bilaterally. GI: Soft, nontender, nondistended, BS + x 4. MS: No deformity or atrophy. Skin: Warm and dry, no rash. Neuro:  Strength and sensation are intact. Psych: Normal affect.  Accessory Clinical Findings    ECG personally reviewed by me today - ST 105 bpm with stable TWI v5  and v6 with nonspecific changes in anterior leads - no acute ST/T wave changes. T waves overall flat likely due to body habitus.   Assessment & Plan    1. HTN - Pregnancy-induced hypertension that did not improve after delivery.  Strong family history of hypertension at an early age.  Renal duplex and renal and aldosterone ratio were normal.  Recent TSH normal.  No symptoms suggestive of pheochromocytoma.  BP 120s in the mornings however elevates to 130s over 80s in the afternoon.  We will add Carvedilol 12.5 mg twice daily for BP goal of <130/80 and mild tachycardia with rate 103 bpm today.  Continue Losartan-HCTZ 100-12.5mg  daily.  Low-sodium, heart healthy diet encouraged.  Regular cardiovascular exercise encouraged.  2. Obesity - Beginning work-up for bariatric surgery.  Continue weight loss encouraged.  Low suspicion she will require ischemic evaluation prior to surgery however we did discuss and if it is required by the surgeon she would prefer cardiac CT rather than Lexiscan.  3. Exertional dyspnea - Her dyspnea on exertion is stable at baseline.  Likely due to deconditioning, weight.  Echocardiogram 09/03/2019 with LVEF 60-65% and no wall motion abnormalities.  She has no chest pain, pressure, tightness.  No indication for ischemic evaluation this time.  Disposition: Start Coreg. She will send MyChart message of BP in 2 weeks. Follow up in 3 month(s) with Dr. Fletcher Anon or APP.   Loel Dubonnet, NP 10/18/2019, 2:30 PM

## 2019-10-18 NOTE — Patient Instructions (Addendum)
Medication Instructions:  Your physician has recommended you make the following change in your medication:   START Coreg 6.25mg  twice daily  *If you need a refill on your cardiac medications before your next appointment, please call your pharmacy*  Lab Work: None ordered today.  Testing/Procedures: You had an EKG today. It showed normal sinus rhythm.   Follow-Up: At [facility], you and your health needs are our priority.  As part of our continuing mission to provide you with exceptional heart care, we have created designated Provider Care Teams.  These Care Teams include your primary Cardiologist (physician) and Advanced Practice Providers (APPs -  Physician Assistants and Nurse Practitioners) who all work together to provide you with the care you need, when you need it.  Your next appointment: With Dr. Kirke Corin in 3 months  Other: Send MyChart message in two weeks with report of blood pressures (BP)  Goal BP <130/80 consistently.

## 2019-11-01 DIAGNOSIS — F509 Eating disorder, unspecified: Secondary | ICD-10-CM | POA: Diagnosis not present

## 2019-11-20 LAB — ALDOSTERONE + RENIN ACTIVITY W/ RATIO
ALDOS/RENIN RATIO: 2.7 (ref 0.0–30.0)
ALDOSTERONE: 9.9 ng/dL (ref 0.0–30.0)
Renin: 3.605 ng/mL/hr (ref 0.167–5.380)

## 2019-11-22 ENCOUNTER — Encounter: Payer: BC Managed Care – PPO | Attending: Surgery | Admitting: Dietician

## 2019-11-22 ENCOUNTER — Encounter: Payer: Self-pay | Admitting: Adult Health

## 2019-11-22 ENCOUNTER — Other Ambulatory Visit: Payer: Self-pay

## 2019-11-22 VITALS — Ht 67.0 in | Wt 266.4 lb

## 2019-11-22 DIAGNOSIS — Z713 Dietary counseling and surveillance: Secondary | ICD-10-CM | POA: Insufficient documentation

## 2019-11-22 DIAGNOSIS — Z79899 Other long term (current) drug therapy: Secondary | ICD-10-CM | POA: Diagnosis not present

## 2019-11-22 DIAGNOSIS — Z880 Allergy status to penicillin: Secondary | ICD-10-CM | POA: Insufficient documentation

## 2019-11-22 DIAGNOSIS — Z833 Family history of diabetes mellitus: Secondary | ICD-10-CM | POA: Diagnosis not present

## 2019-11-22 DIAGNOSIS — Z8632 Personal history of gestational diabetes: Secondary | ICD-10-CM | POA: Insufficient documentation

## 2019-11-22 DIAGNOSIS — Z8249 Family history of ischemic heart disease and other diseases of the circulatory system: Secondary | ICD-10-CM | POA: Diagnosis not present

## 2019-11-22 DIAGNOSIS — I10 Essential (primary) hypertension: Secondary | ICD-10-CM | POA: Insufficient documentation

## 2019-11-22 DIAGNOSIS — Z6841 Body Mass Index (BMI) 40.0 and over, adult: Secondary | ICD-10-CM | POA: Diagnosis not present

## 2019-11-22 NOTE — Progress Notes (Signed)
Pre-Operative Nutrition Class:  Appt start time: 0900   End time:  1100.  Patient was seen on 11/22/19 for Pre-Operative Bariatric Surgery Education at [facility] at [facility].   Surgery date: TBD Surgery type: sleeve gastrectomy Start weight at [facility]: 260.2lbs Weight today: 266.4lbs  InBody  BODY COMP RESULTS    BMI (kg/m^2) 41.7  Fat Mass (lbs) 133.9  Dry Lean Mass (lbs) 35.3  Total Body Water (lbs) 97.2   Samples given per MNT protocol. Patient educated on appropriate usage: Celebrate Vitamins Multivitamin  Lot # [id number] ,  Exp: 11/2020;  Lot# [id number], Exp: 12/2020; [id number]  Exp: 07/2020; Lot# [id number], Exp: 07/2020  Celebrate Vitamins Calcium Citrate   Lot # [id number], Exp: 05/2020; Lot# [id number], Exp: 02/2020; Lot#: [id number], Exp: 03/2020; Lot# [id number], Exp: 04/2020; Lot# [id number], Exp: 03/2020; Lot# [id number], Exp: 03/2020; Lot# [id number], Exp: 07/2020  Renee Pain Protein Powder   Lot # [id number], Exp: 02/2020; Lot#: [id number], Exp: 02/2020; Lot#: [id number], Exp: 02/2020  Premier Protein Shake   Lot# [id number], Exp: 05/03/20   The following the learning objectives were met by the patient during this course:  Identify Pre-Op Dietary Goals and will begin 2 weeks pre-operatively  Identify appropriate sources of fluids and proteins   State protein recommendations and appropriate sources pre and post-operatively  Identify Post-Operative Dietary Goals and will follow for 2 weeks post-operatively  Identify appropriate multivitamin and calcium sources  Describe the need for physical activity post-operatively and will follow MD recommendations  State when to call healthcare provider regarding medication questions or post-operative complications  Handouts given during class include:  Pre-Op Bariatric Surgery Diet Handout  Protein Shake Handout  Post-Op Bariatric Surgery Nutrition Handout  [facility] Program Information Flyer  Support Group Information Flyer  [facility] Bariatric  Supplements Price List  Follow-Up Plan: Patient will follow-up at [place], at about 2 weeks post operatively for diet advancement per MD.

## 2019-12-02 ENCOUNTER — Ambulatory Visit: Payer: BC Managed Care – PPO

## 2019-12-05 ENCOUNTER — Other Ambulatory Visit: Payer: Self-pay | Admitting: Adult Health

## 2019-12-05 MED ORDER — LOSARTAN POTASSIUM-HCTZ 100-12.5 MG PO TABS: 1.0000 | ORAL_TABLET | Freq: Every day | ORAL | 0 refills | Status: DC

## 2019-12-05 NOTE — Telephone Encounter (Signed)
losartan-hydrochlorothiazide (HYZAAR) 100-12.5 MG tablet    Patient is requesting refill.    Pharmacy:  [facility] [address] ([address]), [place] - [address] Phone:  [phone]  Fax:  [phone]

## 2019-12-10 ENCOUNTER — Other Ambulatory Visit: Payer: Self-pay

## 2019-12-10 ENCOUNTER — Encounter: Payer: Self-pay | Admitting: Adult Health

## 2019-12-10 ENCOUNTER — Ambulatory Visit (INDEPENDENT_AMBULATORY_CARE_PROVIDER_SITE_OTHER): Payer: BC Managed Care – PPO | Admitting: Adult Health

## 2019-12-10 VITALS — BP 130/90 | Temp 97.1°F | Wt 262.8 lb

## 2019-12-10 DIAGNOSIS — I10 Essential (primary) hypertension: Secondary | ICD-10-CM

## 2019-12-10 DIAGNOSIS — Z6841 Body Mass Index (BMI) 40.0 and over, adult: Secondary | ICD-10-CM

## 2019-12-10 MED ORDER — LOSARTAN POTASSIUM-HCTZ 100-25 MG PO TABS: 1.0000 | ORAL_TABLET | Freq: Every day | ORAL | 1 refills | Status: DC

## 2019-12-10 NOTE — Progress Notes (Signed)
Established patient visit      Patient: Kendra Stewart   DOB: Nov 15, 1980   39 y.o. Female  MRN: [id number] Visit Date: 12/10/2019  Today's healthcare provider: Jairo Ben, FNP  Subjective:    Chief Complaint  Patient presents with  . Hypertension   HPI  Hypertension, follow-up:  BP Readings from Last 3 Encounters:  12/10/19 130/90  10/18/19 (!) 130/98  08/28/19 (!) 130/98    She was last seen for hypertension 4 months ago.  BP at that visit was 144/102. Management since that visit includes Losartan-HCTZ 100-12.5 mg she is also on Coreg 6.25 mg twice daily, this was added by cardiology Dr. Kirke Corin..She reports good compliance with treatment.  Denies any bradycardia episodes. She is not having side effects.  She is exercising. She is adherent to low salt diet.   Outside blood pressures are 130/90. She is experiencing none.  Patient denies chest pain, chest pressure/discomfort, dyspnea, fatigue, irregular heart beat and syncope.   Cardiovascular risk factors include hypertension.  Use of agents associated with hypertension: NSAIDS.  She is doing well, her diastolic pressure at home is running 90 to 94.  She has not noticed any readings much lower than this.   She is planning a gastric sleeve with Dr. Luretha Murphy in [place] at [facility] on October 25, 2019.'  Denies any new or changing concerns to her health.  Patient has been through all the preop procedures for gastric sleeve.  She is excited for this change in hopes that she can come off of some medications afterwards.  I told her that we will have to monitor her blood pressure closely and possibly take medications off as she loses weight and becomes more healthy with this lifestyle change.  Will monitor at home as well and she has an appointment set up with me for 1 week postop.  Has a history of breast surgery reduction, and tonsillectomy.  She denies any complications with anesthesia in  the past. He is also due for labs, however she reports that she had multiple labs done at [facility] office and will forward those labs to me in my chart for review.  Patient  denies any fever, body aches,chills, rash, chest pain, shortness of breath, nausea, vomiting, or diarrhea.   She  has no other concerns at today's visit.  ------------------------------------------------------------------------    Patient Active Problem List   Diagnosis Date Noted  . Chronic hypertension affecting pregnancy 05/14/2018       Medications: Outpatient Medications Prior to Visit  Medication Sig  . baclofen (LIORESAL) 10 MG tablet Take 1 tablet (10 mg total) by mouth at bedtime as needed for muscle spasms.  . carvedilol (COREG) 6.25 MG tablet Take 1 tablet (6.25 mg total) by mouth 2 (two) times daily.  Marland Kitchen losartan-hydrochlorothiazide (HYZAAR) 100-12.5 MG tablet Take 1 tablet by mouth daily.  . Multiple Vitamin (MULTIVITAMIN ADULT PO) Take by mouth. viviscal  . norethindrone (MICRONOR) 0.35 MG tablet Take 1 tablet (0.35 mg total) by mouth daily. (Patient not taking: Reported on 12/10/2019)  . Vitamin D, Ergocalciferol, (DRISDOL) 1.25 MG (50000 UT) CAPS capsule Take 1 capsule (50,000 Units total) by mouth every 7 (seven) days. (once weekly for 8 weeks) (Patient not taking: Reported on 12/10/2019)   No facility-administered medications prior to visit.    Review of Systems  Constitutional: Negative.   HENT: Negative.   Respiratory: Negative.        Reports her shortness of breath has  decreased substantially since she has been exercising and increasing her endurance and since her blood pressure is more under control.  She denies any shortness of breath in the last month and a half.  He is to have a repeat CT scan of her chest 1 year from 08/2019. See radiology report.   Cardiovascular: Negative.   Gastrointestinal: Negative.   Genitourinary: Negative.   Musculoskeletal: Negative.   Skin: Negative.    Hematological: Negative.   Psychiatric/Behavioral: Negative.     Last CBC Lab Results  Component Value Date   WBC 8.6 08/22/2019   HGB 14.1 08/22/2019   HCT 41.4 08/22/2019   MCV 86 08/22/2019   MCH 29.1 08/22/2019   RDW 12.2 08/22/2019   PLT 284 08/22/2019        Objective:    BP 130/90 (BP Location: Left Arm, Patient Position: Sitting, Cuff Size: Large)   Temp (!) 97.1 F (36.2 C) (Temporal)   Wt 262 lb 12.8 oz (119.2 kg)   BMI 41.16 kg/m  BP Readings from Last 3 Encounters:  12/10/19 130/90  10/18/19 (!) 130/98  08/28/19 (!) 130/98      Physical Exam Vitals reviewed.  Constitutional:      General: She is not in acute distress.    Appearance: Normal appearance. She is well-developed. She is obese. She is not ill-appearing, toxic-appearing or diaphoretic.     Interventions: She is not intubated.    Comments: Patient is alert and oriented and responsive to questions Engages in eye contact with provider. Speaks in full sentences without any pauses without any shortness of breath or distress.  Patient moves on and off of exam table and in room without difficulty. Gait is normal in hall and in room. Patient is oriented to person place time and situation. Patient answers questions appropriately and engages in conversation.   HENT:     Head: Normocephalic and atraumatic.     Right Ear: External ear normal.     Left Ear: External ear normal.     Nose: Nose normal.     Mouth/Throat:     Pharynx: No oropharyngeal exudate or posterior oropharyngeal erythema.  Eyes:     General: Lids are normal. No scleral icterus.       Right eye: No discharge.        Left eye: No discharge.     Conjunctiva/sclera: Conjunctivae normal.     Right eye: Right conjunctiva is not injected. No exudate or hemorrhage.    Left eye: Left conjunctiva is not injected. No exudate or hemorrhage.    Pupils: Pupils are equal, round, and reactive to light.  Neck:     Thyroid: No thyroid mass or  thyromegaly.     Vascular: Normal carotid pulses. No carotid bruit, hepatojugular reflux or JVD.     Trachea: Trachea and phonation normal. No tracheal tenderness or tracheal deviation.     Meningeal: Brudzinski's sign and Kernig's sign absent.  Cardiovascular:     Rate and Rhythm: Normal rate and regular rhythm.     Pulses: Normal pulses.          Radial pulses are 2+ on the right side and 2+ on the left side.       Dorsalis pedis pulses are 2+ on the right side and 2+ on the left side.       Posterior tibial pulses are 2+ on the right side and 2+ on the left side.     Heart sounds: Normal heart sounds,  S1 normal and S2 normal. Heart sounds not distant. No murmur. No friction rub. No gallop.   Pulmonary:     Effort: Pulmonary effort is normal. No tachypnea, bradypnea, accessory muscle usage or respiratory distress. She is not intubated.     Breath sounds: Normal breath sounds. No stridor. No wheezing, rhonchi or rales.  Chest:     Chest wall: No tenderness.  Abdominal:     General: Bowel sounds are normal. There is no distension or abdominal bruit.     Palpations: Abdomen is soft. There is no shifting dullness, fluid wave, hepatomegaly, splenomegaly, mass or pulsatile mass.     Tenderness: There is no abdominal tenderness. There is no right CVA tenderness, left CVA tenderness, guarding or rebound.     Hernia: No hernia is present.  Musculoskeletal:        General: No tenderness or deformity. Normal range of motion.     Cervical back: Full passive range of motion without pain, normal range of motion and neck supple. No edema, erythema or rigidity. No spinous process tenderness or muscular tenderness. Normal range of motion.  Lymphadenopathy:     Head:     Right side of head: No submental, submandibular, tonsillar, preauricular, posterior auricular or occipital adenopathy.     Left side of head: No submental, submandibular, tonsillar, preauricular, posterior auricular or occipital  adenopathy.     Cervical: No cervical adenopathy.     Right cervical: No superficial, deep or posterior cervical adenopathy.    Left cervical: No superficial, deep or posterior cervical adenopathy.     Upper Body:     Right upper body: No supraclavicular or pectoral adenopathy.     Left upper body: No supraclavicular or pectoral adenopathy.  Skin:    General: Skin is warm and dry.     Capillary Refill: Capillary refill takes less than 2 seconds.     Coloration: Skin is not jaundiced or pale.     Findings: No abrasion, bruising, burn, ecchymosis, erythema, lesion, petechiae or rash.     Nails: There is no clubbing.  Neurological:     General: No focal deficit present.     Mental Status: She is alert and oriented to person, place, and time.     GCS: GCS eye subscore is 4. GCS verbal subscore is 5. GCS motor subscore is 6.     Cranial Nerves: No cranial nerve deficit.     Sensory: No sensory deficit.     Motor: No weakness, tremor, atrophy, abnormal muscle tone or seizure activity.     Coordination: Coordination normal.     Gait: Gait normal.     Deep Tendon Reflexes: Reflexes are normal and symmetric. Reflexes normal. Babinski sign absent on the right side. Babinski sign absent on the left side.     Reflex Scores:      Tricep reflexes are 2+ on the right side and 2+ on the left side.      Bicep reflexes are 2+ on the right side and 2+ on the left side.      Brachioradialis reflexes are 2+ on the right side and 2+ on the left side.      Patellar reflexes are 2+ on the right side and 2+ on the left side.      Achilles reflexes are 2+ on the right side and 2+ on the left side. Psychiatric:        Mood and Affect: Mood normal.  Speech: Speech normal.        Behavior: Behavior normal.        Thought Content: Thought content normal.        Judgment: Judgment normal.       No results found for any visits on 12/10/19.    Assessment & Plan:  BMI 40.0-44.9, adult  (HCC)  Hypertension, unspecified type   Meds ordered this encounter  Medications  . losartan-hydrochlorothiazide (HYZAAR) 100-25 MG tablet    Sig: Take 1 tablet by mouth daily.    Dispense:  90 tablet    Refill:  1   Medications Discontinued During This Encounter  Medication Reason  . losartan-hydrochlorothiazide (HYZAAR) 100-12.5 MG tablet Discontinued by provider   Will increased HCTZ, as above discussed side effects and to take in the morning.  She will send copy of all her labs to my office prior to her procedure for review.   She is to report any new or changing health concerns to me immediately.  She will keep log of her blood pressures and report any new or changing hypotension or hypertension.  Careful management post gastric sleeve as she loses weight and improves her health discussed. Advised patient call the office or your primary care doctor for an appointment if no improvement within 72 hours or if any symptoms change or worsen at any time  Advised ER or urgent Care if after hours or on weekend. Call 911 for emergency symptoms at any time.Patinet verbalized understanding of all instructions given/reviewed and treatment plan and has no further questions or concerns at this time.    She has a one week post operative from gastric sleeve procedure scheduled with me.  She has no other questions or concerns at today's visit and is aware she can reach out should she.   The entirety of the information documented in the History of Present Illness, Review of Systems and Physical Exam were personally obtained by me. Portions of this information were initially documented by the  Certified Medical Assistant whose name is documented in Epic and reviewed by me for thoroughness and accuracy.  I have personally performed the exam and reviewed the chart and it is accurate to the best of my knowledge.  Museum/gallery conservator has been used and any errors in dictation or transcription are  unintentional.  Eula Fried. Azahel Belcastro FNP-C  [facility]      Jairo Ben, FNP  [facility] [phone] (phone) [phone] (fax)  [facility]

## 2019-12-10 NOTE — Patient Instructions (Signed)
Hypertension, Adult Hypertension is another name for high blood pressure. High blood pressure forces your heart to work harder to pump blood. This can cause problems over time. There are two numbers in a blood pressure reading. There is a top number (systolic) over a bottom number (diastolic). It is best to have a blood pressure that is below 120/80. Healthy choices can help lower your blood pressure, or you may need medicine to help lower it. What are the causes? The cause of this condition is not known. Some conditions may be related to high blood pressure. What increases the risk?  Smoking.  Having type 2 diabetes mellitus, high cholesterol, or both.  Not getting enough exercise or physical activity.  Being overweight.  Having too much fat, sugar, calories, or salt (sodium) in your diet.  Drinking too much alcohol.  Having long-term (chronic) kidney disease.  Having a family history of high blood pressure.  Age. Risk increases with age.  Race. You may be at higher risk if you are African American.  Gender. Men are at higher risk than women before age 45. After age 65, women are at higher risk than men.  Having obstructive sleep apnea.  Stress. What are the signs or symptoms?  High blood pressure may not cause symptoms. Very high blood pressure (hypertensive crisis) may cause: ? Headache. ? Feelings of worry or nervousness (anxiety). ? Shortness of breath. ? Nosebleed. ? A feeling of being sick to your stomach (nausea). ? Throwing up (vomiting). ? Changes in how you see. ? Very bad chest pain. ? Seizures. How is this treated?  This condition is treated by making healthy lifestyle changes, such as: ? Eating healthy foods. ? Exercising more. ? Drinking less alcohol.  Your health care provider may prescribe medicine if lifestyle changes are not enough to get your blood pressure under control, and if: ? Your top number is above 130. ? Your bottom number is above  80.  Your personal target blood pressure may vary. Follow these instructions at home: Eating and drinking   If told, follow the DASH eating plan. To follow this plan: ? Fill one half of your plate at each meal with fruits and vegetables. ? Fill one fourth of your plate at each meal with whole grains. Whole grains include whole-wheat pasta, brown rice, and whole-grain bread. ? Eat or drink low-fat dairy products, such as skim milk or low-fat yogurt. ? Fill one fourth of your plate at each meal with low-fat (lean) proteins. Low-fat proteins include fish, chicken without skin, eggs, beans, and tofu. ? Avoid fatty meat, cured and processed meat, or chicken with skin. ? Avoid pre-made or processed food.  Eat less than 1,500 mg of salt each day.  Do not drink alcohol if: ? Your doctor tells you not to drink. ? You are pregnant, may be pregnant, or are planning to become pregnant.  If you drink alcohol: ? Limit how much you use to:  0-1 drink a day for women.  0-2 drinks a day for men. ? Be aware of how much alcohol is in your drink. In the [place] one drink equals one 12 oz bottle of beer (355 mL), one 5 oz glass of wine (148 mL), or one 1 oz glass of hard liquor (44 mL). Lifestyle   Work with your doctor to stay at a healthy weight or to lose weight. Ask your doctor what the best weight is for you.  Get at least 30 minutes of exercise most   days of the week. This may include walking, swimming, or biking.  Get at least 30 minutes of exercise that strengthens your muscles (resistance exercise) at least 3 days a week. This may include lifting weights or doing Pilates.  Do not use any products that contain nicotine or tobacco, such as cigarettes, e-cigarettes, and chewing tobacco. If you need help quitting, ask your doctor.  Check your blood pressure at home as told by your doctor.  Keep all follow-up visits as told by your doctor. This is important. Medicines  Take over-the-counter  and prescription medicines only as told by your doctor. Follow directions carefully.  Do not skip doses of blood pressure medicine. The medicine does not work as well if you skip doses. Skipping doses also puts you at risk for problems.  Ask your doctor about side effects or reactions to medicines that you should watch for. Contact a doctor if you:  Think you are having a reaction to the medicine you are taking.  Have headaches that keep coming back (recurring).  Feel dizzy.  Have swelling in your ankles.  Have trouble with your vision. Get help right away if you:  Get a very bad headache.  Start to feel mixed up (confused).  Feel weak or numb.  Feel faint.  Have very bad pain in your: ? Chest. ? Belly (abdomen).  Throw up more than once.  Have trouble breathing. Summary  Hypertension is another name for high blood pressure.  High blood pressure forces your heart to work harder to pump blood.  For most people, a normal blood pressure is less than 120/80.  Making healthy choices can help lower blood pressure. If your blood pressure does not get lower with healthy choices, you may need to take medicine. This information is not intended to replace advice given to you by your health care provider. Make sure you discuss any questions you have with your health care provider. Document Revised: 05/02/2018 Document Reviewed: 05/02/2018 Elsevier Patient Education  2020 Elsevier Inc. DASH Eating Plan DASH stands for "Dietary Approaches to Stop Hypertension." The DASH eating plan is a healthy eating plan that has been shown to reduce high blood pressure (hypertension). It may also reduce your risk for type 2 diabetes, heart disease, and stroke. The DASH eating plan may also help with weight loss. What are tips for following this plan?  General guidelines  Avoid eating more than 2,300 mg (milligrams) of salt (sodium) a day. If you have hypertension, you may need to reduce your  sodium intake to 1,500 mg a day.  Limit alcohol intake to no more than 1 drink a day for nonpregnant women and 2 drinks a day for men. One drink equals 12 oz of beer, 5 oz of wine, or 1 oz of hard liquor.  Work with your health care provider to maintain a healthy body weight or to lose weight. Ask what an ideal weight is for you.  Get at least 30 minutes of exercise that causes your heart to beat faster (aerobic exercise) most days of the week. Activities may include walking, swimming, or biking.  Work with your health care provider or diet and nutrition specialist (dietitian) to adjust your eating plan to your individual calorie needs. Reading food labels   Check food labels for the amount of sodium per serving. Choose foods with less than 5 percent of the Daily Value of sodium. Generally, foods with less than 300 mg of sodium per serving fit into this eating plan.    To find whole grains, look for the word "whole" as the first word in the ingredient list. Shopping  Buy products labeled as "low-sodium" or "no salt added."  Buy fresh foods. Avoid canned foods and premade or frozen meals. Cooking  Avoid adding salt when cooking. Use salt-free seasonings or herbs instead of table salt or sea salt. Check with your health care provider or pharmacist before using salt substitutes.  Do not fry foods. Cook foods using healthy methods such as baking, boiling, grilling, and broiling instead.  Cook with heart-healthy oils, such as olive, canola, soybean, or sunflower oil. Meal planning  Eat a balanced diet that includes: ? 5 or more servings of fruits and vegetables each day. At each meal, try to fill half of your plate with fruits and vegetables. ? Up to 6-8 servings of whole grains each day. ? Less than 6 oz of lean meat, poultry, or fish each day. A 3-oz serving of meat is about the same size as a deck of cards. One egg equals 1 oz. ? 2 servings of low-fat dairy each day. ? A serving of  nuts, seeds, or beans 5 times each week. ? Heart-healthy fats. Healthy fats called Omega-3 fatty acids are found in foods such as flaxseeds and coldwater fish, like sardines, salmon, and mackerel.  Limit how much you eat of the following: ? Canned or prepackaged foods. ? Food that is high in trans fat, such as fried foods. ? Food that is high in saturated fat, such as fatty meat. ? Sweets, desserts, sugary drinks, and other foods with added sugar. ? Full-fat dairy products.  Do not salt foods before eating.  Try to eat at least 2 vegetarian meals each week.  Eat more home-cooked food and less restaurant, buffet, and fast food.  When eating at a restaurant, ask that your food be prepared with less salt or no salt, if possible. What foods are recommended? The items listed may not be a complete list. Talk with your dietitian about what dietary choices are best for you. Grains Whole-grain or whole-wheat bread. Whole-grain or whole-wheat pasta. Brown rice. Oatmeal. Quinoa. Bulgur. Whole-grain and low-sodium cereals. Pita bread. Low-fat, low-sodium crackers. Whole-wheat flour tortillas. Vegetables Fresh or frozen vegetables (raw, steamed, roasted, or grilled). Low-sodium or reduced-sodium tomato and vegetable juice. Low-sodium or reduced-sodium tomato sauce and tomato paste. Low-sodium or reduced-sodium canned vegetables. Fruits All fresh, dried, or frozen fruit. Canned fruit in natural juice (without added sugar). Meat and other protein foods Skinless chicken or turkey. Ground chicken or turkey. Pork with fat trimmed off. Fish and seafood. Egg whites. Dried beans, peas, or lentils. Unsalted nuts, nut butters, and seeds. Unsalted canned beans. Lean cuts of beef with fat trimmed off. Low-sodium, lean deli meat. Dairy Low-fat (1%) or fat-free (skim) milk. Fat-free, low-fat, or reduced-fat cheeses. Nonfat, low-sodium ricotta or cottage cheese. Low-fat or nonfat yogurt. Low-fat, low-sodium  cheese. Fats and oils Soft margarine without trans fats. Vegetable oil. Low-fat, reduced-fat, or light mayonnaise and salad dressings (reduced-sodium). Canola, safflower, olive, soybean, and sunflower oils. Avocado. Seasoning and other foods Herbs. Spices. Seasoning mixes without salt. Unsalted popcorn and pretzels. Fat-free sweets. What foods are not recommended? The items listed may not be a complete list. Talk with your dietitian about what dietary choices are best for you. Grains Baked goods made with fat, such as croissants, muffins, or some breads. Dry pasta or rice meal packs. Vegetables Creamed or fried vegetables. Vegetables in a cheese sauce. Regular canned vegetables (not   low-sodium or reduced-sodium). Regular canned tomato sauce and paste (not low-sodium or reduced-sodium). Regular tomato and vegetable juice (not low-sodium or reduced-sodium). Rosita Fire. Olives. Fruits Canned fruit in a light or heavy syrup. Fried fruit. Fruit in cream or butter sauce. Meat and other protein foods Fatty cuts of meat. Ribs. Fried meat. Tomasa Blase. Sausage. Bologna and other processed lunch meats. Salami. Fatback. Hotdogs. Bratwurst. Salted nuts and seeds. Canned beans with added salt. Canned or smoked fish. Whole eggs or egg yolks. Chicken or [place] with skin. Dairy Whole or 2% milk, cream, and half-and-half. Whole or full-fat cream cheese. Whole-fat or sweetened yogurt. Full-fat cheese. Nondairy creamers. Whipped toppings. Processed cheese and cheese spreads. Fats and oils Butter. Stick margarine. Lard. Shortening. Ghee. Bacon fat. Tropical oils, such as coconut, palm kernel, or palm oil. Seasoning and other foods Salted popcorn and pretzels. Onion salt, garlic salt, seasoned salt, table salt, and sea salt. Worcestershire sauce. Tartar sauce. Barbecue sauce. Teriyaki sauce. Soy sauce, including reduced-sodium. Steak sauce. Canned and packaged gravies. Fish sauce. Oyster sauce. Cocktail sauce. Horseradish  that you find on the shelf. Ketchup. Mustard. Meat flavorings and tenderizers. Bouillon cubes. Hot sauce and Tabasco sauce. Premade or packaged marinades. Premade or packaged taco seasonings. Relishes. Regular salad dressings. Where to find more information:  National Heart, Lung, and Blood Institute: PopSteam.is  American Heart Association: www.heart.org Summary  The DASH eating plan is a healthy eating plan that has been shown to reduce high blood pressure (hypertension). It may also reduce your risk for type 2 diabetes, heart disease, and stroke.  With the DASH eating plan, you should limit salt (sodium) intake to 2,300 mg a day. If you have hypertension, you may need to reduce your sodium intake to 1,500 mg a day.  When on the DASH eating plan, aim to eat more fresh fruits and vegetables, whole grains, lean proteins, low-fat dairy, and heart-healthy fats.  Work with your health care provider or diet and nutrition specialist (dietitian) to adjust your eating plan to your individual calorie needs. This information is not intended to replace advice given to you by your health care provider. Make sure you discuss any questions you have with your health care provider. Document Revised: 08/04/2017 Document Reviewed: 08/15/2016 Elsevier Patient Education  2020 Elsevier Inc. Hydrochlorothiazide, HCTZ; Losartan Tablets O que  este medicamento? LOSARTANA/HIDROCLOROTIAZIDA  uma combinao de um medicamento que relaxa os vasos sanguneos e um medicamento diurtico. Este medicamento  usado para tratar a presso alta. Este medicamento tambm pode diminuir o risco de derrame em alguns pacientes. Este medicamento pode ser usado para outros propsitos; em caso de dvidas, pergunte ao seu profissional de sade ou farmacutico. NOMES DE MARCAS COMUNS: Hyzaar O que devo dizer a meu profissional de sade antes de tomar este medicamento? Precisam saber se voc tem algum dos seguintes problemas ou  estados de sade:  diminuio na quantidade de urina  diabetes  doenas renais  doenas hepticas  dieta especial (por exemplo, dieta com pouco sal)  doenas do sistema imunitrio, como o lpus  reao estranha ou alergia  losartana,  hidroclorotiazida, ou s sulfonamidas  reao estranha ou alergia a outros medicamentos, alimentos, corantes ou conservantes  est grvida ou tentando engravidar  est [employer] devo usar este medicamento? Tome este medicamento por via oral com um copo d'gua. Siga as instrues na embalagem ou na bula. Voc pode tomar este medicamento com ou sem comida. Se este medicamento lhe fizer mal ao estmago, tome-o com comida. [employer] em  intervalos regulares. No tome este medicamento com frequncia maior do que a indicada. No pare de usar [employer] a menos que seu mdico [facility]. Fale com seu pediatra a respeito do uso deste medicamento em crianas. Pode ser preciso tomar alguns cuidados especiais. Superdosagem: Se achar que tomou uma superdosagem deste medicamento, entre em contato imediatamente com o [facility] ou v a [facility]. OBSERVAO: Este medicamento  s para voc. No compartilhe este medicamento com outras pessoas. E se eu deixar de tomar uma dose? Se perder uma dose, tome-a assim que possvel. Se j estiver quase na hora da sua prxima dose, tome somente essa dose. No tome o remdio em dobro, nem tome uma dose adicional. O que pode interagir com este medicamento?  barbitricos, como o fenobarbital  medicamentos para hipertenso  celecoxibe  cimetidina  corticosteroides  medicamentos para diabetes  diurticos, principalmente triantereno, espironolactona ou amilorida  fluconazol  ltio  AINEs, medicamentos analgsicos e anti-inflamatrios, como ibuprofeno, naproxeno  sais ou suplementos de potssio  analgsicos com receita mdica  rifampicina  bloqueadores  neuromusculares, como a tubocurarina  alguns medicamentos que abaixam os nveis de colesterol, como colestiramina, colestipol Esta lista pode no descrever todas as interaes possveis. D ao seu profissional de sade uma lista de todos os medicamentos, ervas medicinais, remdios de venda livre, ou suplementos alimentares que voc [place]. Diga tambm se voc fuma, bebe, ou [place] drogas ilcitas. Alguns destes podem interagir com o seu medicamento. Ao que devo ficar atento quando estiver [employer] medicamento? Mea a sua presso regularmente enquanto estiver tomando [employer]. Pergunte a seu mdico ou profissional de sade qual deve ser a sua presso, e quando deve contat-lo(a). Aps medir sua presso, anote os valores para mostr-los ao seu mdico ou profissional de sade. Se estiver tomando este medicamento por um perodo prolongado, consulte seu mdico ou profissional de sade para acompanhamento regular da sua evoluo. No se esquea de marcar consultas peridicas. Voc no pode se desidratar. Pergunte ao seu mdico ou profissional de sade quanto lquido deve beber por dia. Se tiver uma crise grave de diarreia, enjoo e vmitos ou se estiver suando em [place], entre em contato com seu mdico ou profissional de sade. Tomar este medicamento pode ser perigoso se tiver perdido [employer] lquido. [place] que queiram engravidar ou que acreditem que possam estar grvidas devem avisar aos seus mdicos. H risco de graves efeitos colaterais no feto, principalmente no segundo ou terceiro trimestre. Para mais informaes, fale com seu mdico, profissional de sade ou farmacutico. Voc pode sentir sonolncia ou tontura. No dirija, no opere mquinas e no faa nada que exija concentrao mental at [employer] como o medicamento lhe afeta. No se sente nem se levante rpido demais, principalmente se for um paciente idoso. Isso diminui o risco de [place] ou desmaio. O lcool pode piorar a tontura e a  sonolncia. Evite bebidas alcolicas. Este medicamento pode aumentar a glicemia. Pergunte ao seu mdico ou profissional de sade se  necessrio fazer alteraes  sua dieta ou  medicao, ou se tiver diabetes. Fale com seu mdico ou profissional de sade a respeito do risco de cncer de pele. Voc pode ter um maior risco de determinados tipos de cncer de pele se tomar este medicamento. Este medicamento pode aumentar sua sensibilidade ao sol. No tome sol. Se no puder evitar a exposio ao sol, use roupas protetoras e protetor solar. No use lmpadas de luz solar e no faa bronzeamento artificial. Evite substitutos de sal, a  menos que instrudo a us-los por seu mdico ou profissional de sade. Enquanto estiver [employer], no se automedique em caso de tosse, resfriado ou dores. Pea orientao ao seu mdico ou profissional de sade. Alguns ingredientes podem aumentar a sua presso. Que efeitos colaterais posso sentir aps usar este medicamento? Efeitos colaterais que devem ser informados ao seu mdico ou profissional de sade o mais rpido possvel:  reaes alrgicas, como erupo na pele, coceira, urticria, ou inchao do rosto, dos lbios ou da lngua  dificuldade para respirar  alteraes na viso  urina escura  dor nos olhos  batimento cardaco acelerado ou irregular  palpitaes  dor no peito  tontura ou desmaio  espasmos ou cibras musculares  tosse seca persistente  vermelhido, bolhas, descamao ou afrouxamento da pele, inclusive dentro da boca  sinais e sintomas de hiperglicemia, tais como ter mais or sede ou fome, ou precisar [employer] do que o normal. Tambm pode se sentir muito cansado ou ter viso turva.  dor de estmago  dificuldade para urinar  sangramentos ou hematomas fora do comum  piora da dor gotosa  olhos ou pele amarelados Efeitos colaterais que normalmente no precisam de cuidados mdicos (avise ao seu mdico ou profissional de  sade se persistirem ou forem incmodos):  mudana na libido ou no desempenho sexual  dor de cabea Esta lista pode no descrever todos os efeitos colaterais possveis. Para mais orientaes sobre efeitos colaterais, consulte o seu mdico. Voc pode relatar a ocorrncia de efeitos colaterais  FDA pelo telefone [phone]. Onde devo guardar meu medicamento? [facility] fora do [employer]. Conservar em temperatura ambiente, entre 15 e 30 degreesC [phone] e 86 degreesF). Proteger Administrator, arts. Manter o recipiente bem fechado. Descartar qualquer medicamento no utilizado aps a data de validade impressa no rtulo ou embalagem. OBSERVAO: Este folheto  um resumo. Pode no cobrir todas as informaes possveis. Se tiver dvidas a respeito deste medicamento, fale com seu mdico, farmacutico ou profissional de sade.  2020 Elsevier/Gold Standard (2019-06-26 00:00:00)

## 2019-12-11 ENCOUNTER — Other Ambulatory Visit: Payer: Self-pay | Admitting: Adult Health

## 2019-12-11 DIAGNOSIS — R7401 Elevation of levels of liver transaminase levels: Secondary | ICD-10-CM

## 2019-12-11 DIAGNOSIS — R7989 Other specified abnormal findings of blood chemistry: Secondary | ICD-10-CM

## 2019-12-11 NOTE — Progress Notes (Signed)
cmp

## 2019-12-17 ENCOUNTER — Encounter (HOSPITAL_COMMUNITY): Payer: Self-pay

## 2019-12-17 DIAGNOSIS — R7989 Other specified abnormal findings of blood chemistry: Secondary | ICD-10-CM | POA: Diagnosis not present

## 2019-12-17 DIAGNOSIS — R7401 Elevation of levels of liver transaminase levels: Secondary | ICD-10-CM | POA: Diagnosis not present

## 2019-12-17 NOTE — Patient Instructions (Addendum)
DUE TO COVID-19 ONLY ONE VISITOR IS ALLOWED TO COME WITH YOU AND STAY IN THE WAITING ROOM ONLY DURING PRE OP AND PROCEDURE DAY OF SURGERY. TWO  VISITOR MAY VISIT WITH YOU AFTER SURGERY IN YOUR PRIVATE ROOM DURING VISITING HOURS ONLY!   10a - 8p  YOU NEED TO HAVE A COVID 19 TEST ON_4-15-21______ @__1215_____ , THIS TEST MUST BE DONE BEFORE SURGERY,  At [facility]   ONCE YOUR COVID TEST IS COMPLETED, PLEASE BEGIN THE QUARANTINE INSTRUCTIONS AS OUTLINED IN YOUR HANDOUT.                Kendra Stewart  12/17/2019   Your procedure is scheduled on: 12-23-19   Report to [facility] Main  Entrance   Report to admitting at        0845 AM     Call this number if you have problems the morning of surgery [phone]    Remember: MORNING OF SURGERY DRINK:   DRINK 1 G2 drink BEFORE YOU LEAVE HOME, DRINK ALL OF THE  G2 DRINK AT ONE TIME.   NO SOLID FOOD AFTER 600 PM THE NIGHT BEFORE YOUR SURGERY. YOU MAY DRINK CLEAR FLUIDS. THE G2 DRINK YOU DRINK BEFORE YOU LEAVE HOME WILL BE THE LAST FLUIDS YOU DRINK BEFORE SURGERY.  PAIN IS EXPECTED AFTER SURGERY AND WILL NOT BE COMPLETELY ELIMINATED. AMBULATION AND TYLENOL WILL HELP REDUCE INCISIONAL AND GAS PAIN. MOVEMENT IS KEY!  YOU ARE EXPECTED TO BE OUT OF BED WITHIN 4 HOURS OF ADMISSION TO YOUR PATIENT ROOM.  SITTING IN THE RECLINER THROUGHOUT THE DAY IS IMPORTANT FOR DRINKING FLUIDS AND MOVING GAS THROUGHOUT THE GI TRACT.  COMPRESSION STOCKINGS SHOULD BE WORN [facility] STAY UNLESS YOU ARE WALKING.   INCENTIVE SPIROMETER SHOULD BE USED EVERY HOUR WHILE AWAKE TO DECREASE POST-OPERATIVE COMPLICATIONS SUCH AS PNEUMONIA.  WHEN DISCHARGED HOME, IT IS IMPORTANT TO CONTINUE TO WALK EVERY HOUR AND USE THE INCENTIVE SPIROMETER EVERY HOUR.    CLEAR LIQUID DIET   Foods Allowed                                                                                     Foods Excluded  Coffee and tea, regular and decaf   No creamer                            liquids that you cannot  Plain Jell-O any favor except red or purple                                           see through such as: Fruit ices (not with fruit pulp)                                                           milk, soups, orange juice  Iced Popsicles  All solid food                                 Cranberry, grape and apple juices Sports drinks like Gatorade Lightly seasoned clear broth or consume(fat free) Sugar, honey syrup  Sample Menu Breakfast                                Lunch                                     Supper Cranberry juice                    Beef broth                            Chicken broth Jell-O                                     Grape juice                           Apple juice Coffee or tea                        Jell-O                                      Popsicle                                                Coffee or tea                        Coffee or tea  _____________________________________________________________________   BRUSH YOUR TEETH MORNING OF SURGERY AND RINSE YOUR MOUTH OUT, NO CHEWING GUM CANDY OR MINTS.    Take these medicines the morning of surgery with A SIP OF WATER: coreg                               You may not have any metal on your body including hair pins and              piercings  Do not wear jewelry, make-up, lotions, powders or perfumes, deodorant             Do not wear nail polish on your fingernails.  Do not shave  48 hours prior to surgery.              Do not bring valuables to the hospital. [facility] IS NOT             RESPONSIBLE   FOR VALUABLES.  Contacts, dentures or bridgework may not be worn into surgery.     ____________________________________________________________________         [facility] - Preparing for Surgery Before surgery, you can play an important  role.  Because skin is not sterile, your skin needs to be as free of germs as  possible.  You can reduce the number of germs on your skin by washing with CHG (chlorahexidine gluconate) soap before surgery.  CHG is an antiseptic cleaner which kills germs and bonds with the skin to continue killing germs even after washing. Please DO NOT use if you have an allergy to CHG or antibacterial soaps.  If your skin becomes reddened/irritated stop using the CHG and inform your nurse when you arrive at Short Stay. Do not shave (including legs and underarms) for at least 48 hours prior to the first CHG shower.  You may shave your face/neck. Please follow these instructions carefully:  1.  Shower with CHG Soap the night before surgery and the  morning of Surgery.  2.  If you choose to wash your hair, wash your hair first as usual with your  normal  shampoo.  3.  After you shampoo, rinse your hair and body thoroughly to remove the  shampoo.                           4.  Use CHG as you would any other liquid soap.  You can apply chg directly  to the skin and wash                       Gently with a scrungie or clean washcloth.  5.  Apply the CHG Soap to your body ONLY FROM THE NECK DOWN.   Do not use on face/ open                           Wound or open sores. Avoid contact with eyes, ears mouth and genitals (private parts).                       Wash face,  Genitals (private parts) with your normal soap.             6.  Wash thoroughly, paying special attention to the area where your surgery  will be performed.  7.  Thoroughly rinse your body with warm water from the neck down.  8.  DO NOT shower/wash with your normal soap after using and rinsing off  the CHG Soap.                9.  Pat yourself dry with a clean towel.            10.  Wear clean pajamas.            11.  Place clean sheets on your bed the night of your first shower and do not  sleep with pets. Day of Surgery : Do not apply any lotions/deodorants the morning of surgery.  Please wear clean clothes to the hospital/surgery  center.  FAILURE TO FOLLOW THESE INSTRUCTIONS MAY RESULT IN THE CANCELLATION OF YOUR SURGERY PATIENT SIGNATURE_________________________________  NURSE SIGNATURE__________________________________  ________________________________________________________________________   Adam Phenix  An incentive spirometer is a tool that can help keep your lungs clear and active. This tool measures how well you are filling your lungs with each breath. Taking long deep breaths may help reverse or decrease the chance of developing breathing (pulmonary) problems (especially infection) following:  A long period of time when you are unable to move or be active. BEFORE THE PROCEDURE  If the spirometer includes an indicator to show your best effort, your nurse or respiratory therapist will set it to a desired goal.  If possible, sit up straight or lean slightly forward. Try not to slouch.  Hold the incentive spirometer in an upright position. INSTRUCTIONS FOR USE  1. Sit on the edge of your bed if possible, or sit up as far as you can in bed or on a chair. 2. Hold the incentive spirometer in an upright position. 3. Breathe out normally. 4. Place the mouthpiece in your mouth and seal your lips tightly around it. 5. Breathe in slowly and as deeply as possible, raising the piston or the ball toward the top of the column. 6. Hold your breath for 3-5 seconds or for as long as possible. Allow the piston or ball to fall to the bottom of the column. 7. Remove the mouthpiece from your mouth and breathe out normally. 8. Rest for a few seconds and repeat Steps 1 through 7 at least 10 times every 1-2 hours when you are awake. Take your time and take a few normal breaths between deep breaths. 9. The spirometer may include an indicator to show your best effort. Use the indicator as a goal to work toward during each repetition. 10. After each set of 10 deep breaths, practice coughing to be sure your lungs are  clear. If you have an incision (the cut made at the time of surgery), support your incision when coughing by placing a pillow or rolled up towels firmly against it. Once you are able to get out of bed, walk around indoors and cough well. You may stop using the incentive spirometer when instructed by your caregiver.  RISKS AND COMPLICATIONS  Take your time so you do not get dizzy or light-headed.  If you are in pain, you may need to take or ask for pain medication before doing incentive spirometry. It is harder to take a deep breath if you are having pain. AFTER USE  Rest and breathe slowly and easily.  It can be helpful to keep track of a log of your progress. Your caregiver can provide you with a simple table to help with this. If you are using the spirometer at home, follow these instructions: SEEK MEDICAL CARE IF:   You are having difficultly using the spirometer.  You have trouble using the spirometer as often as instructed.  Your pain medication is not giving enough relief while using the spirometer.  You develop fever of 100.5 F (38.1 C) or higher. SEEK IMMEDIATE MEDICAL CARE IF:   You cough up bloody sputum that had not been present before.  You develop fever of 102 F (38.9 C) or greater.  You develop worsening pain at or near the incision site. MAKE SURE YOU:   Understand these instructions.  Will watch your condition.  Will get help right away if you are not doing well or get worse. Document Released: 01/02/2007 Document Revised: 11/14/2011 Document Reviewed: 03/05/2007 Exit[employer] Patient Information 2014 [employer], [place].   ________________________________________________________________________  WHAT IS A BLOOD TRANSFUSION? Blood Transfusion Information  A transfusion is the replacement of blood or some of its parts. Blood is made up of multiple cells which provide different functions.  Red blood cells carry oxygen and are used for blood loss  replacement.  White blood cells fight against infection.  Platelets control bleeding.  Plasma helps clot blood.  Other blood products are available for specialized needs, such as hemophilia or other clotting  disorders. BEFORE THE TRANSFUSION  Who gives blood for transfusions?   Healthy volunteers who are fully evaluated to make sure their blood is safe. This is blood bank blood. Transfusion therapy is the safest it has ever been in the practice of medicine. Before blood is taken from a donor, a complete history is taken to make sure that person has no history of diseases nor engages in risky social behavior (examples are intravenous drug use or sexual activity with multiple partners). The donor's travel history is screened to minimize risk of transmitting infections, such as malaria. The donated blood is tested for signs of infectious diseases, such as HIV and hepatitis. The blood is then tested to be sure it is compatible with you in order to minimize the chance of a transfusion reaction. If you or a relative donates blood, this is often done in anticipation of surgery and is not appropriate for emergency situations. It takes many days to process the donated blood. RISKS AND COMPLICATIONS Although transfusion therapy is very safe and saves many lives, the main dangers of transfusion include:   Getting an infectious disease.  Developing a transfusion reaction. This is an allergic reaction to something in the blood you were given. Every precaution is taken to prevent this. The decision to have a blood transfusion has been considered carefully by your caregiver before blood is given. Blood is not given unless the benefits outweigh the risks. AFTER THE TRANSFUSION  Right after receiving a blood transfusion, you will usually feel much better and more energetic. This is especially true if your red blood cells have gotten low (anemic). The transfusion raises the level of the red blood cells which  carry oxygen, and this usually causes an energy increase.  The nurse administering the transfusion will monitor you carefully for complications. HOME CARE INSTRUCTIONS  No special instructions are needed after a transfusion. You may find your energy is better. Speak with your caregiver about any limitations on activity for underlying diseases you may have. SEEK MEDICAL CARE IF:   Your condition is not improving after your transfusion.  You develop redness or irritation at the intravenous (IV) site. SEEK IMMEDIATE MEDICAL CARE IF:  Any of the following symptoms occur over the next 12 hours:  Shaking chills.  You have a temperature by mouth above 102 F (38.9 C), not controlled by medicine.  Chest, back, or muscle pain.  People around you feel you are not acting correctly or are confused.  Shortness of breath or difficulty breathing.  Dizziness and fainting.  You get a rash or develop hives.  You have a decrease in urine output.  Your urine turns a dark color or changes to pink, red, or brown. Any of the following symptoms occur over the next 10 days:  You have a temperature by mouth above 102 F (38.9 C), not controlled by medicine.  Shortness of breath.  Weakness after normal activity.  The white part of the eye turns yellow (jaundice).  You have a decrease in the amount of urine or are urinating less often.  Your urine turns a dark color or changes to pink, red, or brown. Document Released: 08/19/2000 Document Revised: 11/14/2011 Document Reviewed: 04/07/2008 [facility] Patient Information 2014 [place], [place].  _______________________________________________________________________

## 2019-12-17 NOTE — Progress Notes (Signed)
PCP -  Flinchum, Eula Fried, FNP Cardiologist-  Lorine Bears, MD  lov 10-18-19 epic  Chest x-ray - Ct chest 08-22-19 epic EKG - 10-18-19 epic Stress Test -  ECHO - 08-24-19 epic Cardiac Cath -  Cbc/diff, CMP 12-17-19 in epic  Sleep Study -  CPAP -   Fasting Blood Sugar -  Checks Blood Sugar _____ times a day  Blood Thinner Instructions: Aspirin Instructions: Last Dose:  Anesthesia review: HTN   Patient denies shortness of breath, fever, cough and chest pain at PAT appointment   none   Patient verbalized understanding of instructions that were given to them at the PAT appointment. Patient was also instructed that they will need to review over the PAT instructions again at home before surgery.

## 2019-12-18 ENCOUNTER — Encounter (HOSPITAL_COMMUNITY): Payer: Self-pay

## 2019-12-18 ENCOUNTER — Other Ambulatory Visit: Payer: Self-pay

## 2019-12-18 ENCOUNTER — Encounter (HOSPITAL_COMMUNITY)
Admission: RE | Admit: 2019-12-18 | Discharge: 2019-12-18 | Disposition: A | Discharge status: Home / Self Care | Payer: BC Managed Care – PPO | Source: Ambulatory Visit | Attending: Surgery | Admitting: Surgery

## 2019-12-18 ENCOUNTER — Encounter: Payer: Self-pay | Admitting: Adult Health

## 2019-12-18 DIAGNOSIS — Z01818 Encounter for other preprocedural examination: Secondary | ICD-10-CM | POA: Insufficient documentation

## 2019-12-18 HISTORY — DX: Personal history of gestational diabetes: Z86.32

## 2019-12-18 LAB — HEPATIC FUNCTION PANEL: Bilirubin, Direct: 0.12 mg/dL (ref 0.00–0.40)

## 2019-12-18 LAB — HEPATITIS PANEL, ACUTE
Hep A IgM: NEGATIVE
Hep B C IgM: NEGATIVE
Hep C Virus Ab: 0.1 s/co ratio (ref 0.0–0.9)
Hepatitis B Surface Ag: NEGATIVE

## 2019-12-18 LAB — CBC WITH DIFFERENTIAL/PLATELET
Basophils Absolute: 0 10*3/uL (ref 0.0–0.2)
Basos: 1 %
EOS (ABSOLUTE): 0.3 10*3/uL (ref 0.0–0.4)
Eos: 5 %
Hematocrit: 41.4 % (ref 34.0–46.6)
Hemoglobin: 14.1 g/dL (ref 11.1–15.9)
Immature Grans (Abs): 0 10*3/uL (ref 0.0–0.1)
Immature Granulocytes: 0 %
Lymphocytes Absolute: 1.9 10*3/uL (ref 0.7–3.1)
Lymphs: 31 %
MCH: 30.4 pg (ref 26.6–33.0)
MCHC: 34.1 g/dL (ref 31.5–35.7)
MCV: 89 fL (ref 79–97)
Monocytes Absolute: 0.5 10*3/uL (ref 0.1–0.9)
Monocytes: 9 %
Neutrophils Absolute: 3.3 10*3/uL (ref 1.4–7.0)
Neutrophils: 54 %
Platelets: 248 10*3/uL (ref 150–450)
RBC: 4.64 x10E6/uL (ref 3.77–5.28)
RDW: 12.6 % (ref 11.7–15.4)
WBC: 6.1 10*3/uL (ref 3.4–10.8)

## 2019-12-18 LAB — COMPREHENSIVE METABOLIC PANEL
ALT: 124 IU/L — ABNORMAL HIGH (ref 0–32)
AST: 72 IU/L — ABNORMAL HIGH (ref 0–40)
Albumin/Globulin Ratio: 1.7 (ref 1.2–2.2)
Albumin: 4.3 g/dL (ref 3.8–4.8)
Alkaline Phosphatase: 45 IU/L (ref 39–117)
BUN/Creatinine Ratio: 18 (ref 9–23)
BUN: 12 mg/dL (ref 6–20)
Bilirubin Total: 0.3 mg/dL (ref 0.0–1.2)
CO2: 17 mmol/L — ABNORMAL LOW (ref 20–29)
Calcium: 9.5 mg/dL (ref 8.7–10.2)
Chloride: 102 mmol/L (ref 96–106)
Creatinine, Ser: 0.67 mg/dL (ref 0.57–1.00)
GFR calc Af Amer: 130 mL/min/{1.73_m2} (ref >59)
GFR calc non Af Amer: 113 mL/min/{1.73_m2} (ref >59)
Globulin, Total: 2.5 g/dL (ref 1.5–4.5)
Glucose: 136 mg/dL — ABNORMAL HIGH (ref 65–99)
Potassium: 4.6 mmol/L (ref 3.5–5.2)
Sodium: 135 mmol/L (ref 134–144)
Total Protein: 6.8 g/dL (ref 6.0–8.5)

## 2019-12-18 LAB — LIPASE: Lipase: 39 U/L (ref 14–72)

## 2019-12-18 LAB — HCV COMMENT:

## 2019-12-18 LAB — AMYLASE: Amylase: 60 U/L (ref 31–110)

## 2019-12-18 LAB — HIV ANTIBODY (ROUTINE TESTING W REFLEX): HIV Screen 4th Generation wRfx: NONREACTIVE

## 2019-12-18 LAB — HEPATITIS C ANTIBODY (REFLEX): HCV Ab: 0.1 s/co ratio (ref 0.0–0.9)

## 2019-12-18 NOTE — Progress Notes (Signed)
  Sent to MyChart  CBC is within normal limits.  Were you fasting with these labs ? Glucose is elevated and you have not been elevated in the past.  AST and ALT liver enzymes are elevated, any excessive tylenol, alcohol, supplements ? Any other medications you are taking /? Any new symptoms of fatigue, soret hroat ?  Bilirubin is within normal limits. Acute hepatitis A, B and C are negative.  Amylase/ lipase are within normal limits.  Need to repeat CMP in 1 month for repeat liver function. Be sure to inform Dr. Daphine Deutscher at today's appointment as well of labs.

## 2019-12-19 ENCOUNTER — Other Ambulatory Visit
Admission: RE | Admit: 2019-12-19 | Discharge: 2019-12-19 | Disposition: A | Discharge status: Home / Self Care | Payer: BC Managed Care – PPO | Source: Ambulatory Visit | Attending: Surgery | Admitting: Surgery

## 2019-12-19 DIAGNOSIS — Z20822 Contact with and (suspected) exposure to covid-19: Secondary | ICD-10-CM | POA: Insufficient documentation

## 2019-12-19 DIAGNOSIS — Z01812 Encounter for preprocedural laboratory examination: Secondary | ICD-10-CM | POA: Diagnosis not present

## 2019-12-19 LAB — SARS CORONAVIRUS 2 (TAT 6-24 HRS): SARS Coronavirus 2: NEGATIVE

## 2019-12-20 ENCOUNTER — Encounter (HOSPITAL_COMMUNITY)
Admission: RE | Admit: 2019-12-20 | Discharge: 2019-12-20 | Disposition: A | Discharge status: Home / Self Care | Payer: BC Managed Care – PPO | Source: Ambulatory Visit | Attending: Surgery | Admitting: Surgery

## 2019-12-20 ENCOUNTER — Other Ambulatory Visit: Payer: Self-pay

## 2019-12-20 DIAGNOSIS — Z6841 Body Mass Index (BMI) 40.0 and over, adult: Secondary | ICD-10-CM | POA: Diagnosis not present

## 2019-12-20 DIAGNOSIS — Z79899 Other long term (current) drug therapy: Secondary | ICD-10-CM | POA: Diagnosis not present

## 2019-12-20 DIAGNOSIS — I10 Essential (primary) hypertension: Secondary | ICD-10-CM | POA: Diagnosis not present

## 2019-12-20 DIAGNOSIS — Z8632 Personal history of gestational diabetes: Secondary | ICD-10-CM | POA: Diagnosis not present

## 2019-12-20 DIAGNOSIS — Z01812 Encounter for preprocedural laboratory examination: Secondary | ICD-10-CM | POA: Insufficient documentation

## 2019-12-20 DIAGNOSIS — Z8249 Family history of ischemic heart disease and other diseases of the circulatory system: Secondary | ICD-10-CM | POA: Diagnosis not present

## 2019-12-20 DIAGNOSIS — Z818 Family history of other mental and behavioral disorders: Secondary | ICD-10-CM | POA: Diagnosis not present

## 2019-12-20 DIAGNOSIS — K219 Gastro-esophageal reflux disease without esophagitis: Secondary | ICD-10-CM | POA: Diagnosis not present

## 2019-12-20 LAB — ABO/RH: ABO/RH(D): A POS

## 2019-12-22 MED ORDER — BUPIVACAINE LIPOSOME 1.3 % IJ SUSP
20.0000 mL | Freq: Once | INTRAMUSCULAR | Status: DC
  Filled 2019-12-22: qty 20

## 2019-12-22 NOTE — H&P (Signed)
Chief Complaint:  Morbid obesity  History of Present Illness:  Kendra Stewart is an 39 y.o. female who has suffered obesity her entire adult life despite numerous attempts to lose weight.  She had gestational diabetes and equivocal GER.  She lives in [place].  She denies any history of DVT although she was worked up for this in January.    She has "spit" vicryl suture material after a breast reduction.    Past Medical History:  Diagnosis Date  . Anemia   . Anxiety   . History of gestational diabetes   . Pregnancy induced hypertension    early in pregnancy    Past Surgical History:  Procedure Laterality Date  . BREAST REDUCTION SURGERY    . BREAST SURGERY    . LEEP     2012  . TONSILLECTOMY      Current Facility-Administered Medications  Medication Dose Route Frequency Provider Last Rate Last Admin  . [START ON 12/23/2019] bupivacaine liposome (EXPAREL) 1.3 % injection 266 mg  20 mL Infiltration Once Herby Abraham, [facility]       Current Outpatient Medications  Medication Sig Dispense Refill  . baclofen (LIORESAL) 10 MG tablet Take 1 tablet (10 mg total) by mouth at bedtime as needed for muscle spasms. 30 each 0  . carvedilol (COREG) 6.25 MG tablet Take 1 tablet (6.25 mg total) by mouth 2 (two) times daily. 60 tablet 2  . losartan-hydrochlorothiazide (HYZAAR) 100-25 MG tablet Take 1 tablet by mouth daily. 90 tablet 1  . norethindrone (MICRONOR) 0.35 MG tablet Take 1 tablet (0.35 mg total) by mouth daily. (Patient not taking: Reported on 12/10/2019) 1 Package 1  . Vitamin D, Ergocalciferol, (DRISDOL) 1.25 MG (50000 UT) CAPS capsule Take 1 capsule (50,000 Units total) by mouth every 7 (seven) days. (once weekly for 8 weeks) (Patient not taking: Reported on 12/10/2019) 8 capsule 0   Penicillins Family History  Problem Relation Age of Onset  . Hypertension Mother   . Depression Mother   . Anxiety disorder Mother   . Hyperlipidemia Mother   . Hyperlipidemia Father   . Hypertension Father     Social History:   reports that she has never smoked. She has never used smokeless tobacco. She reports previous alcohol use. She reports that she does not use drugs.   REVIEW OF SYSTEMS : Negative except for see problem list.  Apparently allergic to vicryl  Physical Exam:   Last menstrual period 12/02/2019, not currently breastfeeding. There is no height or weight on file to calculate BMI.  Gen:  WDWN F NAD  Neurological: Alert and oriented to person, place, and time. Motor and sensory function is grossly intact  Head: Normocephalic and atraumatic.  Eyes: Conjunctivae are normal. Pupils are equal, round, and reactive to light. No scleral icterus.  Neck: Normal range of motion. Neck supple. No tracheal deviation or thyromegaly present.  Cardiovascular:  SR without murmurs or gallops.  No carotid bruits Breast:  Breast reduction Respiratory: Effort normal.  No respiratory distress. No chest wall tenderness. Breath sounds normal.  No wheezes, rales or rhonchi.  Abdomen:  Prior BTL  nontender GU:  Not examined Musculoskeletal: Normal range of motion. Extremities are nontender. No cyanosis, edema or clubbing noted Lymphadenopathy: No cervical, preauricular, postauricular or axillary adenopathy is present Skin: Skin is warm and dry. No rash noted. No diaphoresis. No erythema. No pallor. Pscyh: Normal mood and affect. Behavior is normal. Judgment and thought content normal.   LABORATORY RESULTS: Results for  orders placed or performed during the hospital encounter of 12/20/19 (from the past 48 hour(s))  Type and screen     Status: None   Collection Time: 12/20/19 10:27 AM  Result Value Ref Range   ABO/RH(D) A POS    Antibody Screen NEG    Sample Expiration 01/03/2020,2359    Extend sample reason      NO TRANSFUSIONS OR PREGNANCY IN THE PAST 3 MONTHS Performed at [facility] [address], [place], [place] [address]   ABO/Rh     Status: None   Collection Time:  12/20/19 10:27 AM  Result Value Ref Range   ABO/RH(D)      A POS Performed at [facility], [place] [address] [place], [place] [address]      RADIOLOGY RESULTS: No results found.  Problem List: Patient Active Problem List   Diagnosis Date Noted  . Chronic hypertension affecting pregnancy 05/14/2018    Assessment & Plan: BMI ~41 with reaction to vicryl suture.      Matt B. Hassell Done, MD, [facility] [phone] beeper [phone]  12/22/2019 8:04 AM

## 2019-12-23 ENCOUNTER — Inpatient Hospital Stay (HOSPITAL_COMMUNITY)
Admission: RE | Admit: 2019-12-23 | Discharge: 2019-12-24 | DRG: 621 | Disposition: A | Discharge status: Home / Self Care | Payer: BC Managed Care – PPO | Attending: Surgery | Admitting: Surgery

## 2019-12-23 ENCOUNTER — Inpatient Hospital Stay (HOSPITAL_COMMUNITY): Payer: BC Managed Care – PPO | Admitting: Anesthesiology

## 2019-12-23 ENCOUNTER — Encounter (HOSPITAL_COMMUNITY)
Admission: RE | Disposition: A | Discharge status: Home / Self Care | Payer: Self-pay | Source: Home / Self Care | Attending: Surgery

## 2019-12-23 ENCOUNTER — Inpatient Hospital Stay (HOSPITAL_COMMUNITY): Payer: BC Managed Care – PPO | Admitting: Physician Assistant

## 2019-12-23 ENCOUNTER — Other Ambulatory Visit: Payer: Self-pay

## 2019-12-23 ENCOUNTER — Encounter (HOSPITAL_COMMUNITY): Payer: Self-pay | Admitting: Surgery

## 2019-12-23 DIAGNOSIS — Z9884 Bariatric surgery status: Secondary | ICD-10-CM

## 2019-12-23 DIAGNOSIS — I10 Essential (primary) hypertension: Secondary | ICD-10-CM | POA: Diagnosis present

## 2019-12-23 DIAGNOSIS — Z8249 Family history of ischemic heart disease and other diseases of the circulatory system: Secondary | ICD-10-CM | POA: Diagnosis not present

## 2019-12-23 DIAGNOSIS — K219 Gastro-esophageal reflux disease without esophagitis: Secondary | ICD-10-CM | POA: Diagnosis present

## 2019-12-23 DIAGNOSIS — Z8632 Personal history of gestational diabetes: Secondary | ICD-10-CM | POA: Diagnosis not present

## 2019-12-23 DIAGNOSIS — Z79899 Other long term (current) drug therapy: Secondary | ICD-10-CM | POA: Diagnosis not present

## 2019-12-23 DIAGNOSIS — Z6841 Body Mass Index (BMI) 40.0 and over, adult: Secondary | ICD-10-CM | POA: Diagnosis not present

## 2019-12-23 DIAGNOSIS — Z818 Family history of other mental and behavioral disorders: Secondary | ICD-10-CM | POA: Diagnosis not present

## 2019-12-23 HISTORY — PX: LAPAROSCOPIC GASTRIC SLEEVE RESECTION: SHX5895

## 2019-12-23 LAB — TYPE AND SCREEN
ABO/RH(D): A POS
Antibody Screen: NEGATIVE

## 2019-12-23 LAB — PREGNANCY, URINE: Preg Test, Ur: NEGATIVE

## 2019-12-23 LAB — CREATININE, SERUM
Creatinine, Ser: 0.67 mg/dL (ref 0.44–1.00)
GFR calc Af Amer: >60 mL/min (ref >60)
GFR calc non Af Amer: >60 mL/min (ref >60)

## 2019-12-23 LAB — CBC
HCT: 42.3 % (ref 36.0–46.0)
Hemoglobin: 13.8 g/dL (ref 12.0–15.0)
MCH: 29.6 pg (ref 26.0–34.0)
MCHC: 32.6 g/dL (ref 30.0–36.0)
MCV: 90.8 fL (ref 80.0–100.0)
Platelets: 217 10*3/uL (ref 150–400)
RBC: 4.66 MIL/uL (ref 3.87–5.11)
RDW: 12.2 % (ref 11.5–15.5)
WBC: 8.9 10*3/uL (ref 4.0–10.5)
nRBC: 0 % (ref 0.0–0.2)

## 2019-12-23 SURGERY — GASTRECTOMY, SLEEVE, LAPAROSCOPIC
Anesthesia: General | Site: Abdomen

## 2019-12-23 MED ORDER — ONDANSETRON HCL 4 MG/2ML IJ SOLN: 4.0000 mg | INTRAMUSCULAR | Status: DC | PRN

## 2019-12-23 MED ORDER — KETOROLAC TROMETHAMINE 30 MG/ML IJ SOLN
INTRAMUSCULAR | Status: AC
  Filled 2019-12-23: qty 1

## 2019-12-23 MED ORDER — LACTATED RINGERS IV SOLN: INTRAVENOUS | Status: DC

## 2019-12-23 MED ORDER — METOPROLOL TARTRATE 5 MG/5ML IV SOLN: 5.0000 mg | Freq: Four times a day (QID) | INTRAVENOUS | Status: DC | PRN

## 2019-12-23 MED ORDER — ACETAMINOPHEN 500 MG PO TABS
1000.0000 mg | ORAL_TABLET | Freq: Three times a day (TID) | ORAL | Status: DC
  Administered 2019-12-23 – 2019-12-24 (×2): 1000 mg via ORAL
  Filled 2019-12-23 (×2): qty 2

## 2019-12-23 MED ORDER — PANTOPRAZOLE SODIUM 40 MG IV SOLR
40.0000 mg | Freq: Every day | INTRAVENOUS | Status: DC
  Administered 2019-12-23: 40 mg via INTRAVENOUS
  Filled 2019-12-23: qty 40

## 2019-12-23 MED ORDER — HYDROMORPHONE HCL 1 MG/ML IJ SOLN
INTRAMUSCULAR | Status: AC
  Filled 2019-12-23: qty 1

## 2019-12-23 MED ORDER — APREPITANT 40 MG PO CAPS
40.0000 mg | ORAL_CAPSULE | ORAL | Status: AC
  Administered 2019-12-23: 40 mg via ORAL
  Filled 2019-12-23: qty 1

## 2019-12-23 MED ORDER — ENSURE MAX PROTEIN PO LIQD
2.0000 [oz_av] | ORAL | Status: DC
  Administered 2019-12-24 (×2): 2 [oz_av] via ORAL

## 2019-12-23 MED ORDER — EPHEDRINE 5 MG/ML INJ
INTRAVENOUS | Status: AC
  Filled 2019-12-23: qty 10

## 2019-12-23 MED ORDER — ACETAMINOPHEN 160 MG/5ML PO SOLN
1000.0000 mg | Freq: Three times a day (TID) | ORAL | Status: DC
  Administered 2019-12-23: 1000 mg via ORAL
  Filled 2019-12-23: qty 40.6

## 2019-12-23 MED ORDER — KETAMINE HCL 10 MG/ML IJ SOLN
INTRAMUSCULAR | Status: DC | PRN
  Administered 2019-12-23: 55 mg via INTRAVENOUS

## 2019-12-23 MED ORDER — LIDOCAINE HCL 2 % IJ SOLN
INTRAMUSCULAR | Status: AC
  Filled 2019-12-23: qty 20

## 2019-12-23 MED ORDER — HYDROMORPHONE HCL 1 MG/ML IJ SOLN
0.2500 mg | INTRAMUSCULAR | Status: DC | PRN
  Administered 2019-12-23 (×4): 0.5 mg via INTRAVENOUS

## 2019-12-23 MED ORDER — FENTANYL CITRATE (PF) 250 MCG/5ML IJ SOLN
INTRAMUSCULAR | Status: AC
  Filled 2019-12-23: qty 5

## 2019-12-23 MED ORDER — SODIUM CHLORIDE (PF) 0.9 % IJ SOLN
INTRAMUSCULAR | Status: AC
  Filled 2019-12-23: qty 10

## 2019-12-23 MED ORDER — LACTATED RINGERS IR SOLN
Status: DC | PRN
  Administered 2019-12-23: 1000 mL

## 2019-12-23 MED ORDER — SUGAMMADEX SODIUM 200 MG/2ML IV SOLN
INTRAVENOUS | Status: DC | PRN
  Administered 2019-12-23: 250 mg via INTRAVENOUS

## 2019-12-23 MED ORDER — CHLORHEXIDINE GLUCONATE CLOTH 2 % EX PADS: 6.0000 | MEDICATED_PAD | Freq: Once | CUTANEOUS | Status: DC

## 2019-12-23 MED ORDER — DEXAMETHASONE SODIUM PHOSPHATE 10 MG/ML IJ SOLN
INTRAMUSCULAR | Status: DC | PRN
  Administered 2019-12-23: 10 mg via INTRAVENOUS

## 2019-12-23 MED ORDER — ROCURONIUM BROMIDE 10 MG/ML (PF) SYRINGE
PREFILLED_SYRINGE | INTRAVENOUS | Status: AC
  Filled 2019-12-23: qty 10

## 2019-12-23 MED ORDER — ROCURONIUM BROMIDE 10 MG/ML (PF) SYRINGE
PREFILLED_SYRINGE | INTRAVENOUS | Status: DC | PRN
  Administered 2019-12-23: 70 mg via INTRAVENOUS
  Administered 2019-12-23: 10 mg via INTRAVENOUS

## 2019-12-23 MED ORDER — MORPHINE SULFATE (PF) 4 MG/ML IV SOLN: 1.0000 mg | INTRAVENOUS | Status: DC | PRN

## 2019-12-23 MED ORDER — PHENYLEPHRINE HCL-NACL 10-0.9 MG/250ML-% IV SOLN
INTRAVENOUS | Status: DC | PRN
  Administered 2019-12-23: 20 ug/min via INTRAVENOUS

## 2019-12-23 MED ORDER — KCL IN DEXTROSE-NACL 20-5-0.45 MEQ/L-%-% IV SOLN
INTRAVENOUS | Status: DC
  Filled 2019-12-23 (×2): qty 1000

## 2019-12-23 MED ORDER — ONDANSETRON HCL 4 MG/2ML IJ SOLN
INTRAMUSCULAR | Status: AC
  Filled 2019-12-23: qty 2

## 2019-12-23 MED ORDER — FENTANYL CITRATE (PF) 250 MCG/5ML IJ SOLN
INTRAMUSCULAR | Status: DC | PRN
  Administered 2019-12-23 (×2): 50 ug via INTRAVENOUS
  Administered 2019-12-23: 100 ug via INTRAVENOUS

## 2019-12-23 MED ORDER — HEPARIN SODIUM (PORCINE) 5000 UNIT/ML IJ SOLN
5000.0000 [IU] | INTRAMUSCULAR | Status: AC
  Administered 2019-12-23: 5000 [IU] via SUBCUTANEOUS
  Filled 2019-12-23: qty 1

## 2019-12-23 MED ORDER — PHENYLEPHRINE 40 MCG/ML (10ML) SYRINGE FOR IV PUSH (FOR BLOOD PRESSURE SUPPORT)
PREFILLED_SYRINGE | INTRAVENOUS | Status: DC | PRN
  Administered 2019-12-23 (×3): 80 ug via INTRAVENOUS
  Administered 2019-12-23: 40 ug via INTRAVENOUS
  Administered 2019-12-23: 120 ug via INTRAVENOUS

## 2019-12-23 MED ORDER — SUGAMMADEX SODIUM 500 MG/5ML IV SOLN
INTRAVENOUS | Status: AC
  Filled 2019-12-23: qty 5

## 2019-12-23 MED ORDER — BUPIVACAINE LIPOSOME 1.3 % IJ SUSP
INTRAMUSCULAR | Status: DC | PRN
  Administered 2019-12-23: 20 mL

## 2019-12-23 MED ORDER — LIDOCAINE 2% (20 MG/ML) 5 ML SYRINGE
INTRAMUSCULAR | Status: AC
  Filled 2019-12-23: qty 5

## 2019-12-23 MED ORDER — HEPARIN SODIUM (PORCINE) 5000 UNIT/ML IJ SOLN
5000.0000 [IU] | Freq: Three times a day (TID) | INTRAMUSCULAR | Status: DC
  Administered 2019-12-23 – 2019-12-24 (×3): 5000 [IU] via SUBCUTANEOUS
  Filled 2019-12-23 (×3): qty 1

## 2019-12-23 MED ORDER — LIDOCAINE 2% (20 MG/ML) 5 ML SYRINGE
INTRAMUSCULAR | Status: DC | PRN
  Administered 2019-12-23: 1.5 mg/kg/h via INTRAVENOUS

## 2019-12-23 MED ORDER — PROPOFOL 10 MG/ML IV BOLUS
INTRAVENOUS | Status: AC
  Filled 2019-12-23: qty 20

## 2019-12-23 MED ORDER — SODIUM CHLORIDE (PF) 0.9 % IJ SOLN
INTRAMUSCULAR | Status: DC | PRN
  Administered 2019-12-23: 10 mL

## 2019-12-23 MED ORDER — SCOPOLAMINE 1 MG/3DAYS TD PT72
MEDICATED_PATCH | TRANSDERMAL | Status: AC
  Filled 2019-12-23: qty 1

## 2019-12-23 MED ORDER — OXYCODONE HCL 5 MG/5ML PO SOLN: 5.0000 mg | Freq: Four times a day (QID) | ORAL | Status: DC | PRN

## 2019-12-23 MED ORDER — DEXAMETHASONE SODIUM PHOSPHATE 10 MG/ML IJ SOLN
INTRAMUSCULAR | Status: AC
  Filled 2019-12-23: qty 1

## 2019-12-23 MED ORDER — PROMETHAZINE HCL 25 MG/ML IJ SOLN: 6.2500 mg | INTRAMUSCULAR | Status: DC | PRN

## 2019-12-23 MED ORDER — KETOROLAC TROMETHAMINE 30 MG/ML IJ SOLN
30.0000 mg | Freq: Once | INTRAMUSCULAR | Status: AC | PRN
  Administered 2019-12-23: 30 mg via INTRAVENOUS

## 2019-12-23 MED ORDER — PHENYLEPHRINE 40 MCG/ML (10ML) SYRINGE FOR IV PUSH (FOR BLOOD PRESSURE SUPPORT)
PREFILLED_SYRINGE | INTRAVENOUS | Status: AC
  Filled 2019-12-23: qty 10

## 2019-12-23 MED ORDER — PHENYLEPHRINE HCL (PRESSORS) 10 MG/ML IV SOLN
INTRAVENOUS | Status: AC
  Filled 2019-12-23: qty 1

## 2019-12-23 MED ORDER — ACETAMINOPHEN 500 MG PO TABS
1000.0000 mg | ORAL_TABLET | ORAL | Status: AC
  Administered 2019-12-23: 1000 mg via ORAL
  Filled 2019-12-23: qty 2

## 2019-12-23 MED ORDER — PROPOFOL 10 MG/ML IV BOLUS
INTRAVENOUS | Status: DC | PRN
  Administered 2019-12-23: 200 mg via INTRAVENOUS

## 2019-12-23 MED ORDER — MIDAZOLAM HCL 2 MG/2ML IJ SOLN
INTRAMUSCULAR | Status: AC
  Filled 2019-12-23: qty 2

## 2019-12-23 MED ORDER — 0.9 % SODIUM CHLORIDE (POUR BTL) OPTIME
TOPICAL | Status: DC | PRN
  Administered 2019-12-23: 1000 mL

## 2019-12-23 MED ORDER — SODIUM CHLORIDE 0.9 % IV SOLN
2.0000 g | INTRAVENOUS | Status: AC
  Administered 2019-12-23: 2 g via INTRAVENOUS

## 2019-12-23 MED ORDER — LIDOCAINE 2% (20 MG/ML) 5 ML SYRINGE
INTRAMUSCULAR | Status: DC | PRN
  Administered 2019-12-23: 100 mg via INTRAVENOUS

## 2019-12-23 MED ORDER — ONDANSETRON HCL 4 MG/2ML IJ SOLN
INTRAMUSCULAR | Status: DC | PRN
  Administered 2019-12-23: 4 mg via INTRAVENOUS

## 2019-12-23 MED ORDER — MIDAZOLAM HCL 2 MG/2ML IJ SOLN
INTRAMUSCULAR | Status: DC | PRN
  Administered 2019-12-23: 2 mg via INTRAVENOUS

## 2019-12-23 MED ORDER — SODIUM CHLORIDE 0.9 % IV SOLN
INTRAVENOUS | Status: AC
  Filled 2019-12-23: qty 2

## 2019-12-23 SURGICAL SUPPLY — 62 items
APPLICATOR COTTON TIP 6 STRL (MISCELLANEOUS) IMPLANT
APPLICATOR COTTON TIP 6IN STRL (MISCELLANEOUS)
APPLIER CLIP 5 13 M/L LIGAMAX5 (MISCELLANEOUS)
APPLIER CLIP ROT 10 11.4 M/L (STAPLE)
APPLIER CLIP ROT 13.4 12 LRG (CLIP)
BLADE SURG 15 STRL LF DISP TIS (BLADE) ×1 IMPLANT
BLADE SURG 15 STRL SS (BLADE) ×1
CABLE HIGH FREQUENCY MONO STRZ (ELECTRODE) ×2 IMPLANT
CHLORAPREP W/TINT 26 (MISCELLANEOUS) ×4 IMPLANT
CLIP APPLIE 5 13 M/L LIGAMAX5 (MISCELLANEOUS) IMPLANT
CLIP APPLIE ROT 10 11.4 M/L (STAPLE) IMPLANT
CLIP APPLIE ROT 13.4 12 LRG (CLIP) IMPLANT
COVER BACK TABLE 60X90IN (DRAPES) ×2 IMPLANT
COVER WAND RF STERILE (DRAPES) IMPLANT
DECANTER SPIKE VIAL GLASS SM (MISCELLANEOUS) ×2 IMPLANT
DERMABOND ADVANCED (GAUZE/BANDAGES/DRESSINGS) ×1
DERMABOND ADVANCED .7 DNX12 (GAUZE/BANDAGES/DRESSINGS) ×1 IMPLANT
DEVICE SUT QUICK LOAD TK 5 (STAPLE) IMPLANT
DEVICE SUT TI-KNOT TK 5X26 (MISCELLANEOUS) IMPLANT
DEVICE SUTURE ENDOST 10MM (ENDOMECHANICALS) IMPLANT
DISSECTOR BLUNT TIP ENDO 5MM (MISCELLANEOUS) IMPLANT
DRAPE CARDIOVASC SPLIT 84X147 (DRAPES) ×2 IMPLANT
DRAPE CARDIOVASC SPLIT 88X140 (DRAPES) ×2 IMPLANT
ELECT REM PT RETURN 15FT ADLT (MISCELLANEOUS) ×2 IMPLANT
GAUZE SPONGE 4X4 12PLY STRL (GAUZE/BANDAGES/DRESSINGS) IMPLANT
GLOVE BIOGEL M 8.0 STRL (GLOVE) ×2 IMPLANT
GOWN STRL REUS W/TWL XL LVL3 (GOWN DISPOSABLE) ×8 IMPLANT
GRASPER SUT TROCAR 14GX15 (MISCELLANEOUS) ×2 IMPLANT
HANDLE STAPLE EGIA 4 XL (STAPLE) ×2 IMPLANT
HOVERMATT SINGLE USE (MISCELLANEOUS) ×2 IMPLANT
KIT BASIN OR (CUSTOM PROCEDURE TRAY) ×2 IMPLANT
KIT TURNOVER KIT A (KITS) IMPLANT
MARKER SKIN DUAL TIP RULER LAB (MISCELLANEOUS) ×2 IMPLANT
NEEDLE SPNL 22GX3.5 QUINCKE BK (NEEDLE) ×2 IMPLANT
RELOAD TRI 45 ART MED THCK BLK (STAPLE) ×2 IMPLANT
RELOAD TRI 45 ART MED THCK PUR (STAPLE) IMPLANT
RELOAD TRI 60 ART MED THCK BLK (STAPLE) ×2 IMPLANT
RELOAD TRI 60 ART MED THCK PUR (STAPLE) ×6 IMPLANT
SCISSORS LAP 5X45 EPIX DISP (ENDOMECHANICALS) IMPLANT
SET IRRIG TUBING LAPAROSCOPIC (IRRIGATION / IRRIGATOR) ×2 IMPLANT
SET TUBE SMOKE EVAC HIGH FLOW (TUBING) ×2 IMPLANT
SHEARS HARMONIC ACE PLUS 45CM (MISCELLANEOUS) ×2 IMPLANT
SLEEVE ADV FIXATION 5X100MM (TROCAR) ×4 IMPLANT
SLEEVE GASTRECTOMY 36FR VISIGI (MISCELLANEOUS) ×2 IMPLANT
SOL ANTI FOG 6CC (MISCELLANEOUS) ×1 IMPLANT
SOLUTION ANTI FOG 6CC (MISCELLANEOUS) ×1
SPONGE LAP 18X18 RF (DISPOSABLE) ×4 IMPLANT
STAPLER VISISTAT 35W (STAPLE) IMPLANT
SUT MNCRL AB 4-0 PS2 18 (SUTURE) ×4 IMPLANT
SUT PDS AB 1 CT1 27 (SUTURE) ×2 IMPLANT
SUT SURGIDAC NAB ES-9 0 48 120 (SUTURE) IMPLANT
SYR 10ML ECCENTRIC (SYRINGE) ×2 IMPLANT
SYR 20ML LL LF (SYRINGE) ×2 IMPLANT
TOWEL OR 17X26 10 PK STRL BLUE (TOWEL DISPOSABLE) ×2 IMPLANT
TOWEL OR NON WOVEN STRL DISP B (DISPOSABLE) ×2 IMPLANT
TRAY FOLEY MTR SLVR 16FR STAT (SET/KITS/TRAYS/PACK) IMPLANT
TROCAR ADV FIXATION 5X100MM (TROCAR) ×2 IMPLANT
TROCAR BLADELESS 15MM (ENDOMECHANICALS) ×2 IMPLANT
TROCAR BLADELESS OPT 5 100 (ENDOMECHANICALS) ×2 IMPLANT
TUBE CALIBRATION LAPBAND (TUBING) IMPLANT
TUBING CONNECTING 10 (TUBING) ×2 IMPLANT
TUBING ENDO SMARTCAP (MISCELLANEOUS) ×2 IMPLANT

## 2019-12-23 NOTE — Discharge Instructions (Signed)
° ° ° °GASTRIC BYPASS/SLEEVE ° Home Care Instructions ° ° These instructions are to help you care for yourself when you go home. ° °Call: If you have any problems. °• Call [phone] and ask for the surgeon on call °• If you need immediate help, come to the ER at [facility].  °• Tell the ER staff that you are a new post-op gastric bypass or gastric sleeve patient °  °Signs and symptoms to report: • Severe vomiting or nausea °o If you cannot keep down clear liquids for longer than 1 day, call your surgeon  °• Abdominal pain that does not get better after taking your pain medication °• Fever over 100.4° F with chills °• Heart beating over 100 beats a minute °• Shortness of breath at rest °• Chest pain °•  Redness, swelling, drainage, or foul odor at incision (surgical) sites °•  If your incisions open or pull apart °• Swelling or pain in calf (lower leg) °• Diarrhea (Loose bowel movements that happen often), frequent watery, uncontrolled bowel movements °• Constipation, (no bowel movements for 3 days) if this happens: Pick one °o Milk of Magnesia, 2 tablespoons by mouth, 3 times a day for 2 days if needed °o Stop taking Milk of Magnesia once you have a bowel movement °o Call your doctor if constipation continues °Or °o Miralax  (instead of Milk of Magnesia) following the label instructions °o Stop taking Miralax once you have a bowel movement °o Call your doctor if constipation continues °• Anything you think is not normal °  °Normal side effects after surgery: • Unable to sleep at night or unable to focus °• Irritability or moody °• Being tearful (crying) or depressed °These are common complaints, possibly related to your anesthesia medications that put you to sleep, stress of surgery, and change in lifestyle.  This usually goes away a few weeks after surgery.  If these feelings continue, call your primary care doctor. °  °Wound Care: You may have surgical glue, steri-strips, or staples over your incisions after  surgery °• Surgical glue:  Looks like a clear film over your incisions and will wear off a little at a time °• Steri-strips: Strips of tape over your incisions. You may notice a yellowish color on the skin under the steri-strips. This is used to make the   steri-strips stick better. Do not pull the steri-strips off - let them fall off °• Staples: Staples may be removed before you leave the hospital °o If you go home with staples, call [facility], [phone] at for an appointment with your surgeon’s nurse to have staples removed 10 days after surgery. °• Showering: You may shower two (2) days after your surgery unless your surgeon tells you differently °o Wash gently around incisions with warm soapy water, rinse well, and gently pat dry  °o No tub baths until staples are removed, steri-strips fall off or glue is gone.  °  °Medications: • Medications should be liquid or crushed if larger than the size of a dime °• Extended release pills (medication that release a little bit at a time through the day) should NOT be crushed or cut. (examples include XL, ER, DR, SR) °• Depending on the size and number of medications you take, you may need to space (take a few throughout the day)/change the time you take your medications so that you do not over-fill your pouch (smaller stomach) °• Make sure you follow-up with your primary care doctor to   make medication changes needed during rapid weight loss and life-style changes °• If you have diabetes, follow up with the doctor that orders your diabetes medication(s) within one week after surgery and check your blood sugar regularly. °• Do not drive while taking prescription pain medication  °• It is ok to take Tylenol by the bottle instructions with your pain medicine or instead of your pain medicine as needed.  DO NOT TAKE NSAIDS (EXAMPLES OF NSAIDS:  IBUPROFREN/ NAPROXEN)  °Diet:                    First 2 Weeks ° You will see the dietician t about two (2) weeks  after your surgery. The dietician will increase the types of foods you can eat if you are handling liquids well: °• If you have severe vomiting or nausea and cannot keep down clear liquids lasting longer than 1 day, call your surgeon @ ([phone]) °Protein Shake °• Drink at least 2 ounces of shake 5-6 times per day °• Each serving of protein shakes (usually 8 - 12 ounces) should have: °o 15 grams of protein  °o And no more than 5 grams of carbohydrate  °• Goal for protein each day: °o Men = 80 grams per day °o Women = 60 grams per day °• Protein powder may be added to fluids such as non-fat milk or Lactaid milk or unsweetened Soy/Almond milk (limit to 35 grams added protein powder per serving) ° °Hydration °• Slowly increase the amount of water and other clear liquids as tolerated (See Acceptable Fluids) °• Slowly increase the amount of protein shake as tolerated  °•  Sip fluids slowly and throughout the day.  Do not use straws. °• May use sugar substitutes in small amounts (no more than 6 - 8 packets per day; i.e. Splenda) ° °Fluid Goal °• The first goal is to drink at least 8 ounces of protein shake/drink per day (or as directed by the nutritionist); some examples of protein shakes are Syntrax Nectar, Adkins Advantage, EAS Edge HP, and Unjury. See handout from pre-op Bariatric Education Class: °o Slowly increase the amount of protein shake you drink as tolerated °o You may find it easier to slowly sip shakes throughout the day °o It is important to get your proteins in first °• Your fluid goal is to drink 64 - 100 ounces of fluid daily °o It may take a few weeks to build up to this °• 32 oz (or more) should be clear liquids  °And  °• 32 oz (or more) should be full liquids (see below for examples) °• Liquids should not contain sugar, caffeine, or carbonation ° °Clear Liquids: °• Water or Sugar-free flavored water (i.e. Fruit H2O, Propel) °• Decaffeinated coffee or tea (sugar-free) °• Crystal Lite, Wyler’s Lite,  Minute Maid Lite °• Sugar-free Jell-O °• Bouillon or broth °• Sugar-free Popsicle:   *Less than 20 calories each; Limit 1 per day ° °Full Liquids: °Protein Shakes/Drinks + 2 choices per day of other full liquids °• Full liquids must be: °o No More Than 15 grams of Carbs per serving  °o No More Than 3 grams of Fat per serving °• Strained low-fat cream soup (except Cream of Potato or Tomato) °• Non-Fat milk °• Fat-free Lactaid Milk °• Unsweetened Soy Or Unsweetened Almond Milk °• Low Sugar yogurt (Dannon Lite & Fit, Greek yogurt; Oikos Triple Zero; Chobani Simply 100; Yoplait 100 calorie Greek - No Fruit on the Bottom) ° °  °Vitamins   and Minerals • Start 1 day after surgery unless otherwise directed by your surgeon °• 2 Chewable Bariatric Specific Multivitamin / Multimineral Supplement with iron (Example: Bariatric Advantage Multi EA) °• Chewable Calcium with Vitamin D-3 °(Example: 3 Chewable Calcium Plus 600 with Vitamin D-3) °o Take 500 mg three (3) times a day for a total of 1500 mg each day °o Do not take all 3 doses of calcium at one time as it may cause constipation, and you can only absorb 500 mg  at a time  °o Do not mix multivitamins containing iron with calcium supplements; take 2 hours apart °• Menstruating women and those with a history of anemia (a blood disease that causes weakness) may need extra iron °o Talk with your doctor to see if you need more iron °• Do not stop taking or change any vitamins or minerals until you talk to your dietitian or surgeon °• Your Dietitian and/or surgeon must approve all vitamin and mineral supplements °  °Activity and Exercise: Limit your physical activity as instructed by your doctor.  It is important to continue walking at home.  During this time, use these guidelines: °• Do not lift anything greater than ten (10) pounds for at least two (2) weeks °• Do not go back to work or drive until your surgeon says you can °• You may have sex when you feel comfortable  °o It is  VERY important for female patients to use a reliable birth control method; fertility often increases after surgery  °o All hormonal birth control will be ineffective for 30 days after surgery due to medications given during surgery a barrier method must be used. °o Do not get pregnant for at least 18 months °• Start exercising as soon as your doctor tells you that you can °o Make sure your doctor approves any physical activity °• Start with a simple walking program °• Walk 5-15 minutes each day, 7 days per week.  °• Slowly increase until you are walking 30-45 minutes per day °Consider joining our BELT program. [phone] or email [email or web address] °  °Special Instructions Things to remember: °• Use your CPAP when sleeping if this applies to you ° °• [facility] has two free Bariatric Surgery Support Groups that meet monthly °o The 3rd Thursday of each month, 6 pm, [facility]  °o The 2nd Friday of each month, 11:45 am in the private dining room in the basement of [facility] °• It is very important to keep all follow up appointments with your surgeon, dietitian, primary care physician, and behavioral health practitioner °• Routine follow up schedule with your surgeon include appointments at 2-3 weeks, 6-8 weeks, 6 months, and 1 year at a minimum.  Your surgeon may request to see you more often.   °o After the first year, please follow up with your bariatric surgeon and dietitian at least once a year in order to maintain best weight loss results °[facility]: [phone] °[facility]: [phone] °Bariatric Nurse Coordinator: [phone] °  °   Reviewed and Endorsed  °by [facility] Patient Education Committee, June, 2016 °Edits Approved: Aug, 2018 ° ° ° °

## 2019-12-23 NOTE — Op Note (Signed)
Kendra Stewart [id number] 1981-04-20 12/23/2019  Preoperative diagnosis: severe obesity  Postoperative diagnosis: Same   Procedure: upper endoscopy   Surgeon: Mary Sella. Raeshawn Tafolla M.D., FACS   Anesthesia: Gen.   Indications for procedure: 39 y.o. year old female undergoing Laparoscopic Gastric Sleeve Resection and an EGD was requested to evaluate the new gastric sleeve.   Description of procedure: After we have completed the sleeve resection, I scrubbed out and obtained the Olympus endoscope. I gently placed endoscope in the patient's oropharynx and gently glided it down the esophagus without any difficulty under direct visualization. Once I was in the gastric sleeve, I insufflated the stomach with air. I was able to cannulate and advanced the scope through the gastric sleeve. I was able to cannulate the duodenum with ease. Dr. Daphine Deutscher had placed saline in the upper abdomen. Upon further insufflation of the gastric sleeve there was no evidence of bubbles. GE junction located at 37 cm.  Upon further inspection of the gastric sleeve, the mucosa appeared normal. There is no evidence of any mucosal abnormality. The sleeve was widely patent at the angularis. There was no evidence of bleeding. The gastric sleeve was decompressed. The scope was withdrawn. The patient tolerated this portion of the procedure well. Please see Dr Ermalene Searing operative note for details regarding the laparoscopic gastric sleeve resection.   Mary Sella. Andrey Campanile, MD, FACS  General, Bariatric, & Minimally Invasive Surgery  [facility], [place]

## 2019-12-23 NOTE — Transfer of Care (Signed)
Immediate Anesthesia Transfer of Care Note  Patient: Kendra Stewart  Procedure(s) Performed: LAPAROSCOPIC GASTRIC SLEEVE RESECTION, UPPER ENDO, ERAS Pathway (N/A Abdomen)  Patient Location: PACU  Anesthesia Type:General  Level of Consciousness: drowsy  Airway & Oxygen Therapy: Patient Spontanous Breathing and Patient connected to face mask oxygen  Post-op Assessment: Report given to RN and Post -op Vital signs reviewed and stable  Post vital signs: Reviewed and stable  Last Vitals:  Vitals Value Taken Time  BP 139/99 12/23/19 1244  Temp    Pulse 82 12/23/19 1244  Resp 18 12/23/19 1244  SpO2 98 % 12/23/19 1244  Vitals shown include unvalidated device data.  Last Pain:  Vitals:   12/23/19 0731  TempSrc:   PainSc: 0-No pain         Complications: No apparent anesthesia complications

## 2019-12-23 NOTE — Anesthesia Procedure Notes (Signed)
Procedure Name: Intubation Date/Time: 12/23/2019 11:08 AM Performed by: Florene Route, CRNA Patient Re-evaluated:Patient Re-evaluated prior to induction Oxygen Delivery Method: Circle system utilized Preoxygenation: Pre-oxygenation with 100% oxygen Induction Type: IV induction Ventilation: Mask ventilation without difficulty and Oral airway inserted - appropriate to patient size Laryngoscope Size: Hyacinth Meeker and 2 Grade View: Grade I Tube type: Oral Tube size: 7.5 mm Number of attempts: 1 Airway Equipment and Method: Stylet Placement Confirmation: ETT inserted through vocal cords under direct vision,  positive ETCO2 and breath sounds checked- equal and bilateral Secured at: 22 cm Tube secured with: Tape Dental Injury: Teeth and Oropharynx as per pre-operative assessment

## 2019-12-23 NOTE — Progress Notes (Signed)
Discussed post op day goals with patient including ambulation, IS, diet progression, pain, and nausea control.  BSTOP education provided including BSTOP information guide, "Guide for Pain Management after your Bariatric Procedure".  Questions answered. 

## 2019-12-23 NOTE — Anesthesia Preprocedure Evaluation (Signed)
Anesthesia Evaluation  Patient identified by MRN, date of birth, ID band Patient awake    Reviewed: Allergy & Precautions, NPO status , Patient's Chart, lab work & pertinent test results  Airway Mallampati: II  TM Distance: <3 FB Neck ROM: Full    Dental no notable dental hx.    Pulmonary neg pulmonary ROS,    Pulmonary exam normal breath sounds clear to auscultation       Cardiovascular hypertension, Pt. on medications and Pt. on home beta blockers Normal cardiovascular exam Rhythm:Regular Rate:Normal     Neuro/Psych negative neurological ROS  negative psych ROS   GI/Hepatic negative GI ROS, Neg liver ROS,   Endo/Other  Morbid obesity  Renal/GU negative Renal ROS  negative genitourinary   Musculoskeletal negative musculoskeletal ROS (+)   Abdominal   Peds negative pediatric ROS (+)  Hematology negative hematology ROS (+)   Anesthesia Other Findings   Reproductive/Obstetrics negative OB ROS                             Anesthesia Physical Anesthesia Plan  ASA: II  Anesthesia Plan: General   Post-op Pain Management:    Induction: Intravenous  PONV Risk Score and Plan: 4 or greater and Ondansetron, Dexamethasone, Midazolam, Scopolamine patch - Pre-op and Treatment may vary due to age or medical condition  Airway Management Planned: Oral ETT  Additional Equipment:   Intra-op Plan:   Post-operative Plan: Extubation in OR  Informed Consent: I have reviewed the patients History and Physical, chart, labs and discussed the procedure including the risks, benefits and alternatives for the proposed anesthesia with the patient or authorized representative who has indicated his/her understanding and acceptance.     Dental advisory given  Plan Discussed with: CRNA and Surgeon  Anesthesia Plan Comments:         Anesthesia Quick Evaluation

## 2019-12-23 NOTE — Progress Notes (Signed)
PHARMACY CONSULT FOR:  Risk Assessment for Post-Discharge VTE Following Bariatric Surgery  Post-Discharge VTE Risk Assessment: This patient's probability of 30-day post-discharge VTE is increased due to the factors marked:   Female    Age >/=60 years    BMI >/=50 kg/m2    CHF    Dyspnea at Rest    Paraplegia  x  Non-gastric-band surgery    Operation Time >/=3 hr    Return to OR     Length of Stay >/= 3 d      Hx of VTE   Hypercoagulable condition   Significant venous stasis   Predicted probability of 30-day post-discharge VTE: 0.16%  Recommendation for Discharge: No pharmacologic prophylaxis post-discharge  Kendra Stewart is a 39 y.o. female who underwent  Laparoscopic sleeve resection on 12/23/19   Case start: 1128 Case end: 1237   Allergies  Allergen Reactions  . Penicillins Hives    Has patient had a PCN reaction causing immediate rash, facial/tongue/throat swelling, SOB or lightheadedness with hypotension: no Has patient had a PCN reaction causing severe rash involving mucus membranes or skin necrosis: no Has patient had a PCN reaction that required hospitalization: No Has patient had a PCN reaction occurring within the last 10 years: No If all of the above answers are "NO", then may proceed with Cephalosporin use. Patient states she had some hives with PCN use as small child  . Other     Vicryl Stitches, local reaction.     Patient Measurements: Height: 5\' 7"  (170.2 cm) Weight: 116 kg (255 lb 12.8 oz) IBW/kg (Calculated) : 61.6 Body mass index is 40.06 kg/m.  No results for input(s): WBC, HGB, HCT, PLT, APTT, CREATININE, LABCREA, CREATININE, CREAT24HRUR, MG, PHOS, ALBUMIN, PROT, ALBUMIN, AST, ALT, ALKPHOS, BILITOT, BILIDIR, IBILI in the last 72 hours. Estimated Creatinine Clearance: 126.8 mL/min (by C-G formula based on SCr of 0.67 mg/dL).    Past Medical History:  Diagnosis Date  . Anemia   . Anxiety   . History of gestational diabetes   . Pregnancy induced  hypertension    early in pregnancy     Medications Prior to Admission  Medication Sig Dispense Refill Last Dose  . carvedilol (COREG) 6.25 MG tablet Take 1 tablet (6.25 mg total) by mouth 2 (two) times daily. 60 tablet 2 12/23/2019 at 0600  . losartan-hydrochlorothiazide (HYZAAR) 100-25 MG tablet Take 1 tablet by mouth daily. 90 tablet 1 12/22/2019 at Unknown time  . baclofen (LIORESAL) 10 MG tablet Take 1 tablet (10 mg total) by mouth at bedtime as needed for muscle spasms. 30 each 0 More than a month at Unknown time  . norethindrone (MICRONOR) 0.35 MG tablet Take 1 tablet (0.35 mg total) by mouth daily. (Patient not taking: Reported on 12/10/2019) 1 Package 1   . Vitamin D, Ergocalciferol, (DRISDOL) 1.25 MG (50000 UT) CAPS capsule Take 1 capsule (50,000 Units total) by mouth every 7 (seven) days. (once weekly for 8 weeks) (Patient not taking: Reported on 12/10/2019) 8 capsule 0     02/09/2020, PharmD 12/23/2019,1:14 PM

## 2019-12-23 NOTE — Anesthesia Postprocedure Evaluation (Signed)
Anesthesia Post Note  Patient: Kendra Stewart  Procedure(s) Performed: LAPAROSCOPIC GASTRIC SLEEVE RESECTION, UPPER ENDO, ERAS Pathway (N/A Abdomen)     Patient location during evaluation: PACU Anesthesia Type: General Level of consciousness: awake and alert Pain management: pain level controlled Vital Signs Assessment: post-procedure vital signs reviewed and stable Respiratory status: spontaneous breathing, nonlabored ventilation, respiratory function stable and patient connected to nasal cannula oxygen Cardiovascular status: blood pressure returned to baseline and stable Postop Assessment: no apparent nausea or vomiting Anesthetic complications: no    Last Vitals:  Vitals:   12/23/19 1345 12/23/19 1400  BP: (!) 143/92 (!) 147/91  Pulse: 75 76  Resp: 13 14  Temp:  36.7 C  SpO2: 97% 97%    Last Pain:  Vitals:   12/23/19 1400  TempSrc:   PainSc: 5                  Sanaai Doane S

## 2019-12-23 NOTE — Op Note (Signed)
23 December 2019  Surgeon: Wenda Low, MD, FACS  Asst:  Gaynelle Adu, MD, FACS  Anes:  General endotracheal  Procedure: Laparoscopic sleeve gastrectomy and upper endoscopy  Diagnosis: Morbid obesity  Complications: none  EBL:   minimal cc  Description of Procedure:  The patient was take to OR 1 and given general anesthesia.  The abdomen was prepped with Chloroprep and draped sterilely.  A timeout was performed.  Access to the abdomen was achieved with a 5 mm Optiview through the left upper quadrant .  Following insufflation, the state of the abdomen was found to be free of adhesions and no dimple seen.  Bilateral TAP block with 15 cc on each side.  The ViSiGi 36Fr tube was inserted to deflate the stomach and was pulled back into the esophagus.    The pylorus was identified and we measured 5 cm back and marked the antrum.  At that point we began dissection to take down the greater curvature of the stomach using the Harmonic scalpel.  This dissection was taken all the way up to the left crus.  Posterior attachments of the stomach were also taken down.    The ViSiGi tube was then passed into the antrum and suction applied so that it was snug along the lessor curvature.  The "crow's foot" or incisura was identified.  The sleeve gastrectomy was begun using the [employer] stapler beginning with a 4.5 black and then a 6 cm black and then purple loads.  All with TRS. Up near the top, I did do an extra firing coming in to make the tube more uniform.  When the sleeve was complete the tube was taken off suction and insufflated briefly.  The tube was withdrawn.  Upper endoscopy was then performed by Dr. Andrey Campanile.  The 15 mm trocar was closed with a #1 PDS.   The specimen was extracted through the 15 trocar site.  Local was provided by infiltrating with above TAP and closed 4-0 Monocryl and Dermabond.    Matt B. Daphine Deutscher, MD, [facility], [place] [phone]

## 2019-12-23 NOTE — Interval H&P Note (Signed)
History and Physical Interval Note:  12/23/2019 10:57 AM  Kendra Stewart  has presented today for surgery, with the diagnosis of Morbid Obesity, HTN.  The various methods of treatment have been discussed with the patient and family. After consideration of risks, benefits and other options for treatment, the patient has consented to  Procedure(s): LAPAROSCOPIC GASTRIC SLEEVE RESECTION, UPPER ENDO, ERAS Pathway (N/A) as a surgical intervention.  The patient's history has been reviewed, patient examined, no change in status, stable for surgery.  I have reviewed the patient's chart and labs.  Questions were answered to the patient's satisfaction.     [name]

## 2019-12-23 NOTE — Plan of Care (Addendum)
Pt arrived to the floor and walked to bathroom. Patient demonstrated proper use of Incentive reaching up to 1500.   Patient was offered water at 1630 and drank 60cc's by 1700. Patient complains of mild pain and no nausea. Patient was given Tylenol for pain. Patient walked in hallway 357ft.

## 2019-12-24 LAB — CBC WITH DIFFERENTIAL/PLATELET
Abs Immature Granulocytes: 0.03 10*3/uL (ref 0.00–0.07)
Basophils Absolute: 0 10*3/uL (ref 0.0–0.1)
Basophils Relative: 0 %
Eosinophils Absolute: 0 10*3/uL (ref 0.0–0.5)
Eosinophils Relative: 0 %
HCT: 38.6 % (ref 36.0–46.0)
Hemoglobin: 12.8 g/dL (ref 12.0–15.0)
Immature Granulocytes: 0 %
Lymphocytes Relative: 19 %
Lymphs Abs: 1.6 10*3/uL (ref 0.7–4.0)
MCH: 30.1 pg (ref 26.0–34.0)
MCHC: 33.2 g/dL (ref 30.0–36.0)
MCV: 90.8 fL (ref 80.0–100.0)
Monocytes Absolute: 0.7 10*3/uL (ref 0.1–1.0)
Monocytes Relative: 9 %
Neutro Abs: 5.7 10*3/uL (ref 1.7–7.7)
Neutrophils Relative %: 72 %
Platelets: 216 10*3/uL (ref 150–400)
RBC: 4.25 MIL/uL (ref 3.87–5.11)
RDW: 12 % (ref 11.5–15.5)
WBC: 8 10*3/uL (ref 4.0–10.5)
nRBC: 0 % (ref 0.0–0.2)

## 2019-12-24 LAB — SURGICAL PATHOLOGY

## 2019-12-24 MED ORDER — ONDANSETRON 4 MG PO TBDP: 4.0000 mg | ORAL_TABLET | Freq: Four times a day (QID) | ORAL | 0 refills | Status: DC | PRN

## 2019-12-24 MED ORDER — LOSARTAN POTASSIUM 50 MG PO TABS
100.0000 mg | ORAL_TABLET | Freq: Every day | ORAL | Status: DC
  Administered 2019-12-24: 100 mg via ORAL
  Filled 2019-12-24: qty 2

## 2019-12-24 MED ORDER — PANTOPRAZOLE SODIUM 40 MG PO TBEC: 40.0000 mg | DELAYED_RELEASE_TABLET | Freq: Every day | ORAL | 0 refills | Status: DC

## 2019-12-24 MED ORDER — OXYCODONE HCL 5 MG PO TABS: 5.0000 mg | ORAL_TABLET | Freq: Four times a day (QID) | ORAL | 0 refills | Status: DC | PRN

## 2019-12-24 NOTE — Progress Notes (Signed)
NUTRITION NOTE  Consult for Bariatric Diet teaching per DROP protocol. Patient is POD #1 lap sleeve gastrectomy and upper endoscopy. Patient has already been seen by Bariatric Nurse Educator who reviewed all post-op/pre-discharge information, including diet-related information.  Please re-consult RD if further nutrition-related needs arise.    Arie Gable, MS, RD, LDN, CNSC Inpatient Clinical Dietitian RD pager # available in AMION  After hours/weekend pager # available in AMION  

## 2019-12-24 NOTE — Progress Notes (Signed)
Patient was given discharge instructions and all questions were answered. Patient was taken to the main entrance via wheelchair.

## 2019-12-24 NOTE — Progress Notes (Signed)
Patient alert and oriented, Post op day 1.  Provided support and encouragement.  Encouraged pulmonary toilet, ambulation and small sips of liquids.  Completed 12 ounces of bari clear and 4 ounces protein shake. All questions answered.  Will continue to monitor.

## 2019-12-24 NOTE — Progress Notes (Signed)
Patient alert and oriented, pain is controlled. Patient is tolerating fluids, advanced to protein shake today, patient is tolerating well.  Reviewed Gastric sleeve discharge instructions with patient and patient is able to articulate understanding.  Provided information on BELT program, Support Group and [facility] outpatient pharmacy. All questions answered, will continue to monitor.   Total fluid intake 660 Per dehydration protocol call back one week.  

## 2019-12-24 NOTE — Discharge Summary (Signed)
Physician Discharge Summary  Patient ID: Kendra Stewart MRN: [id number] DOB/AGE: [birth year] 38 y.o.  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    Consults: none  Significant Diagnostic Studies: none    Discharge Exam: Blood pressure (!) 164/109, pulse 83, temperature 97.9 F (36.6 C), temperature source Oral, resp. rate 14, height 5\' 7"  (1.702 m), weight 116 kg, last menstrual period 12/02/2019, SpO2 97 %, not currently breastfeeding. Inciions ok  Disposition: Discharge disposition: 01-Home or Self Care       Discharge Instructions     Ambulate hourly while awake   Complete by: As directed    Call MD for:  difficulty breathing, headache or visual disturbances   Complete by: As directed    Call MD for:  persistant dizziness or light-headedness   Complete by: As directed    Call MD for:  persistant nausea and vomiting   Complete by: As directed    Call MD for:  redness, tenderness, or signs of infection (pain, swelling, redness, odor or green/yellow discharge around incision site)   Complete by: As directed    Call MD for:  severe uncontrolled pain   Complete by: As directed    Call MD for:  temperature >101 F   Complete by: As directed    Diet bariatric full liquid   Complete by: As directed    Incentive spirometry   Complete by: As directed    Perform hourly while awake      Allergies as of 12/24/2019       Reactions   Penicillins Hives   Has patient had a PCN reaction causing immediate rash, facial/tongue/throat swelling, SOB or lightheadedness with hypotension: no Has patient had a PCN reaction causing severe rash involving mucus  membranes or skin necrosis: no Has patient had a PCN reaction that required hospitalization: No Has patient had a PCN reaction occurring within the last 10 years: No If all of the above answers are "NO", then may proceed with Cephalosporin use. Patient states she had some hives with PCN use as small child   Other    Vicryl Stitches, local reaction.         Medication List     TAKE these medications    baclofen 10 MG tablet Commonly known as: LIORESAL Take 1 tablet (10 mg total) by mouth at bedtime as needed for muscle spasms.   carvedilol 6.25 MG tablet Commonly known as: COREG Take 1 tablet (6.25 mg total) by mouth 2 (two) times daily. Notes to patient: Monitor Blood Pressure Daily and keep a log for primary care physician.  You may need to make changes to your medications with rapid weight loss.     losartan-hydrochlorothiazide 100-25 MG tablet Commonly known as: HYZAAR Take 1 tablet by mouth daily. Notes to patient: Monitor Blood Pressure Daily and keep a log for primary care physician.  Monitor for symptoms of dehydration.  You may need to make changes to your medications with rapid weight loss.     norethindrone 0.35 MG tablet Commonly known as: MICRONOR Take 1 tablet (0.35 mg total) by mouth daily.   ondansetron 4 MG disintegrating tablet Commonly known as: ZOFRAN-ODT Take 1 tablet (  4 mg total) by mouth every 6 (six) hours as needed for nausea or vomiting.   oxyCODONE 5 MG immediate release tablet Commonly known as: Oxy IR/ROXICODONE Take 1 tablet (5 mg total) by mouth every 6 (six) hours as needed for severe pain.   pantoprazole 40 MG tablet Commonly known as: PROTONIX Take 1 tablet (40 mg total) by mouth daily.   Vitamin D (Ergocalciferol) 1.25 MG (50000 UNIT) Caps capsule Commonly known as: DRISDOL Take 1 capsule (50,000 Units total) by mouth every 7 (seven) days. (once weekly for 8 weeks)       Follow-up Information     [facility], [facility]. Go on  01/15/2020.   Specialty: General Surgery Why: at 9 am Contact information: [address] [place] [address] [phone]         Johnathan Hausen, MD. Go on 02/13/2020.   Specialty: General Surgery Why: at 9 am at the [facility] office with Dr Johnathan Hausen. Contact information: [place] [address] [phone]            Signed: Pedro Earls 12/24/2019, 10:55 AM

## 2019-12-30 ENCOUNTER — Telehealth (HOSPITAL_COMMUNITY): Payer: Self-pay

## 2019-12-30 NOTE — Telephone Encounter (Signed)
Patient called to discuss post bariatric surgery follow up questions.  See below:   1.  Tell me about your pain and pain management?fook pain medication first 3 days one pill per day  2.  Let's talk about fluid intake.  How much total fluid are you taking in?40 ounces encouraged to increase fluids daily to meet goals of 64 ounces  3.  How much protein have you taken in the last 2 days?30 grams encouraged to increase protein as tolerated, explained importance of protein to retain lean muscle mass and energy  4.  Have you had nausea?  Tell me about when have experienced nausea and what you did to help?needed nausea medication first thing in morning  5.  Has the frequency or color changed with your urine?urine dark in morning, lightens in evening denies headaches dizziness  6.  Tell me what your incisions look like?no problems  7.  Have you been passing gas? BM?several bms since discharge  8.  If a problem or question were to arise who would you call?  Do you know contact numbers for [facility], [facility], and [facility]?aware of how to contact all services  9.  How has the walking going?walking regularly at home  10.  How are your vitamins and calcium going?  How are you taking them?mvi and calcium no problems

## 2020-01-01 ENCOUNTER — Encounter: Payer: Self-pay | Admitting: Adult Health

## 2020-01-01 ENCOUNTER — Other Ambulatory Visit: Payer: Self-pay

## 2020-01-01 ENCOUNTER — Ambulatory Visit: Payer: BC Managed Care – PPO | Admitting: Adult Health

## 2020-01-01 VITALS — BP 122/80 | HR 95 | Temp 96.2°F | Resp 16 | Wt 243.2 lb

## 2020-01-01 DIAGNOSIS — Z9884 Bariatric surgery status: Secondary | ICD-10-CM | POA: Diagnosis not present

## 2020-01-01 DIAGNOSIS — R7401 Elevation of levels of liver transaminase levels: Secondary | ICD-10-CM | POA: Diagnosis not present

## 2020-01-01 DIAGNOSIS — I10 Essential (primary) hypertension: Secondary | ICD-10-CM | POA: Diagnosis not present

## 2020-01-01 DIAGNOSIS — E559 Vitamin D deficiency, unspecified: Secondary | ICD-10-CM

## 2020-01-01 NOTE — Progress Notes (Signed)
Established patient visit   Patient: Kendra Stewart   DOB: Feb 01, 1981   39 y.o. Female  MRN: [id number] Visit Date: 01/01/2020  Today's healthcare provider: Jairo Ben, FNP   Chief Complaint  Patient presents with  . Hypertension   Subjective    HPI Hypertension, follow-up  BP Readings from Last 3 Encounters:  01/01/20 122/80  12/24/19 (!) 164/109  12/20/19 (!) 137/103   Wt Readings from Last 3 Encounters:  01/01/20 243 lb 3.2 oz (110.3 kg)  12/23/19 255 lb 12.8 oz (116 kg)  12/20/19 255 lb 1 oz (115.7 kg)     She was last seen for hypertension 3 weeks ago.  BP at that visit was 130/90. Management since that visit includes increasing the HCTZ from 12.5mg  to 25 mg. She did not start the increase.   She was taking Losartan only in the hospital and not the HCTZ and would like to come off the HCTZ and has also stopped all of her blood pressure medications since being home. Reported readings as documented.  She is status  Post gastric sleeve. 12/23/2019. She was discharged from the hospital last Tuesday. She is on all liquid diet. She also stopped her Coreg since her discharge.  She has been getting 120/80 at home as well with heartrate around 90 or less. Denies any palpations.  Denies any headache.   She is walking and moving status post her gastric sleeve. Denies any shortness of breath, leg or calf pain or any erythema. She reports she feels well.    She reports poor compliance with treatment. Patient reports that she discontinued medication due to her weight loss surgery a week ago. She has a 4 week post operative appointment with Dr. Daphine Deutscher in one month May 2021.  She is not having side effects as she has stopped taking her blood pressure medications since surgery.  She is following a liquid diet. She is not exercising. She does not smoke. Denies any constipation post operative.  Use of agents associated with hypertension: none. Patient has stopped  medications.  She denies any dizziness lightheadedness, syncopal or pre- syncopal episodes.  Outside blood pressures are being checked at home and patient reports range 120/80.  Symptoms:  YES NO    []    [x]    Chest Pain   []    [x]    Chest pressure/discomfort   []    [x]    Palpitations   []    [x]    Dyspnea   []    [x]    Orthopnea   []    [x]    Paroxysmal nocturnal dyspnea   []    [x]    Lower extremity edema   []    [x]   Syncope   Pertinent labs: Lab Results  Component Value Date   CHOL 197 07/29/2019   HDL 47 07/29/2019   LDLCALC 128 (H) 07/29/2019   TRIG 125 07/29/2019   Lab Results  Component Value Date   NA 135 12/17/2019   K 4.6 12/17/2019   CO2 17 (L) 12/17/2019   GLUCOSE 136 (H) 12/17/2019   BUN 12 12/17/2019   CREATININE 0.67 12/23/2019   CALCIUM 9.5 12/17/2019   GFRNONAA >60 12/23/2019   GFRAA >60 12/23/2019     The ASCVD Risk score (Goff DC Jr., et al., 2013) failed to calculate for the following reasons:   The 2013 ASCVD risk score is only valid for ages 49 to 23  07/29/2019 Hemoglobin A 1C was 5.5   ---------------------------------------------------------------------------------------------------  Patient Active Problem  List   Diagnosis Date Noted  . Hypertension 01/01/2020  . Elevated ALT measurement 01/01/2020  . 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 Chronic hypertension affecting pregnancy 05/14/2018   Past Medical History:  Diagnosis Date  . Anemia   . Anxiety   . History of gestational diabetes   . Pregnancy induced hypertension    early in pregnancy   Past Surgical History:  Procedure Laterality Date  . BREAST REDUCTION SURGERY    . BREAST SURGERY    . LAPAROSCOPIC GASTRIC SLEEVE RESECTION N/A 12/23/2019   Procedure: LAPAROSCOPIC GASTRIC SLEEVE RESECTION, UPPER ENDO, ERAS Pathway;  Surgeon: Luretha Murphy, MD;  Location: [facility];  Service: General;  Laterality: N/A;  . LEEP     2012  . TONSILLECTOMY     Social  History   Tobacco Use  . Smoking status: Never Smoker  . Smokeless tobacco: Never Used  Substance Use Topics  . Alcohol use: Not Currently  . Drug use: Never   Family Status  Relation Name Status  . Mother  Alive  . Father  Alive   Allergies  Allergen Reactions  . Penicillins Hives    Has patient had a PCN reaction causing immediate rash, facial/tongue/throat swelling, SOB or lightheadedness with hypotension: no Has patient had a PCN reaction causing severe rash involving mucus membranes or skin necrosis: no Has patient had a PCN reaction that required hospitalization: No Has patient had a PCN reaction occurring within the last 10 years: No If all of the above answers are "NO", then may proceed with Cephalosporin use. Patient states she had some hives with PCN use as small child  . Other     Vicryl Stitches, local reaction.        Medications: Outpatient Medications Prior to Visit  Medication Sig  . pantoprazole (PROTONIX) 40 MG tablet Take 1 tablet (40 mg total) by mouth daily.  . Vitamin D, Ergocalciferol, (DRISDOL) 1.25 MG (50000 UT) CAPS capsule Take 1 capsule (50,000 Units total) by mouth every 7 (seven) days. (once weekly for 8 weeks)  . baclofen (LIORESAL) 10 MG tablet Take 1 tablet (10 mg total) by mouth at bedtime as needed for muscle spasms. (Patient not taking: Reported on 01/01/2020)  . carvedilol (COREG) 6.25 MG tablet Take 1 tablet (6.25 mg total) by mouth 2 (two) times daily. (Patient not taking: Reported on 01/01/2020)  . losartan-hydrochlorothiazide (HYZAAR) 100-25 MG tablet Take 1 tablet by mouth daily. (Patient not taking: Reported on 01/01/2020)  . ondansetron (ZOFRAN-ODT) 4 MG disintegrating tablet Take 1 tablet (4 mg total) by mouth every 6 (six) hours as needed for nausea or vomiting. (Patient not taking: Reported on 01/01/2020)  . [DISCONTINUED] norethindrone (MICRONOR) 0.35 MG tablet Take 1 tablet (0.35 mg total) by mouth daily. (Patient not taking: Reported  on 12/10/2019)  . [DISCONTINUED] oxyCODONE (OXY IR/ROXICODONE) 5 MG immediate release tablet Take 1 tablet (5 mg total) by mouth every 6 (six) hours as needed for severe pain.   No facility-administered medications prior to visit.    Review of Systems  Last CBC Lab Results  Component Value Date   WBC 8.0 12/24/2019   HGB 12.8 12/24/2019   HCT 38.6 12/24/2019   MCV 90.8 12/24/2019   MCH 30.1 12/24/2019   RDW 12.0 12/24/2019   PLT 216 12/24/2019   Last metabolic panel Lab Results  Component Value Date   GLUCOSE 136 (H) 12/17/2019   NA 135 12/17/2019   K 4.6 12/17/2019   CL  102 12/17/2019   CO2 17 (L) 12/17/2019   BUN 12 12/17/2019   CREATININE 0.67 12/23/2019   GFRNONAA >60 12/23/2019   GFRAA >60 12/23/2019   CALCIUM 9.5 12/17/2019   PROT 6.8 12/17/2019   ALBUMIN 4.3 12/17/2019   LABGLOB 2.5 12/17/2019   AGRATIO 1.7 12/17/2019   BILITOT 0.3 12/17/2019   ALKPHOS 45 12/17/2019   AST 72 (H) 12/17/2019   ALT 124 (H) 12/17/2019   ANIONGAP 10 05/14/2018   Last lipids Lab Results  Component Value Date   CHOL 197 07/29/2019   HDL 47 07/29/2019   LDLCALC 128 (H) 07/29/2019   TRIG 125 07/29/2019   Last hemoglobin A1c Lab Results  Component Value Date   HGBA1C 5.5 07/29/2019   Last thyroid functions Lab Results  Component Value Date   TSH 1.380 07/29/2019   Last vitamin D Lab Results  Component Value Date   VD25OH 15.0 (L) 08/22/2019   Last vitamin B12 and Folate Lab Results  Component Value Date   VITAMINB12 542 07/29/2019      Objective    BP 122/80   Pulse 95   Temp (!) 96.2 F (35.7 C) (Oral)   Resp 16   Wt 243 lb 3.2 oz (110.3 kg)   LMP 12/02/2019 (Approximate)   SpO2 99%   BMI 38.09 kg/m  BP Readings from Last 3 Encounters:  01/01/20 122/80  12/24/19 (!) 164/109  12/20/19 (!) 137/103      Physical Exam Constitutional:      General: She is not in acute distress.    Appearance: Normal appearance. She is obese. She is not ill-appearing,  toxic-appearing or diaphoretic.     Comments: Patient appers well, not sickly. Speaking in complete sentences. Patient moves on and off of exam table and in room without difficulty. Gait is normal in hall and in room. Patient is oriented to person place time and situation. Patient answers questions appropriately and engages eye contact and verbal dialect with provider.    HENT:     Head: Normocephalic and atraumatic.     Right Ear: External ear normal.     Left Ear: External ear normal.     Mouth/Throat:     Mouth: Mucous membranes are moist.  Eyes:     Conjunctiva/sclera: Conjunctivae normal.     Pupils: Pupils are equal, round, and reactive to light.  Cardiovascular:     Rate and Rhythm: Normal rate and regular rhythm.     Pulses: Normal pulses.     Heart sounds: Normal heart sounds. No murmur. No friction rub. No gallop.   Pulmonary:     Effort: Pulmonary effort is normal. No respiratory distress.     Breath sounds: Normal breath sounds. No stridor. No wheezing, rhonchi or rales.  Chest:     Chest wall: No tenderness.  Abdominal:     General: Bowel sounds are normal. There are signs of injury (as noted from surgical procedure ).     Palpations: Abdomen is soft.     Tenderness: There is no abdominal tenderness.     Comments: Small laparoscopic incision  on anterior abdomen s/p gastric sleeve, healing well, no surrounding erythema and derma bond intact.   Musculoskeletal:        General: Normal range of motion.     Cervical back: Normal range of motion and neck supple. No tenderness.  Skin:    General: Skin is warm and dry.     Capillary Refill: Capillary refill takes less  than 2 seconds.  Neurological:     General: No focal deficit present.     Mental Status: She is alert and oriented to person, place, and time.     Motor: No weakness.     Gait: Gait normal.     Deep Tendon Reflexes: Reflexes normal.  Psychiatric:        Mood and Affect: Mood normal.        Behavior:  Behavior normal.        Thought Content: Thought content normal.        Judgment: Judgment normal.       No results found for any visits on 01/01/20.  Assessment & Plan     1. Hypertension, unspecified type She will try Coreg 6.25 BID, she wants to start slowly with Coreg again and will start with once daiy and add in second dosage if blood pressure elevated in evening or not controlled with  Goal of 130/80 - monitor heart rate as well. Report heart rate less than 60. Discussed half life and effect and how this medication is dosed.    She has not been on her losartan/ HCTZ  and does not want to restart at this time. She understands she will need to monitor record of blood pressures at least twice daily and keep log call office or message if readings over 130/80 or any symptoms associated with hypertension/ hypotension.   Discussed dangers of hypertension and risks of stroke, cardiomyopathy and other etiologies if not controlled.  2. Elevated ALT measurement Recheck lab in middle of May. If still elevated liver ultrasound advised. She was taking some very strong hair vitamins known to cause elevation in liver enzymes. She has since discontinued those, She has had a negative hepatitis panel acute as well.  - Comprehensive Metabolic Panel (CMET); Future  3. 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 S/P sleeve. Doing well. Denies any gastrointestinal issues, no nausea or vomiting. No signs or symptoms of DVT. Laparoscopic incision sites anterior abdomen healing well.  - CBC with Differential/Platelet; Future Keep follow up with Dr. Hassell Done.  4. Vitamin D deficiency  - VITAMIN D 25 Hydroxy (Vit-D Deficiency, Fractures); Future  Return in about 1 month (around 01/31/2020), or if symptoms worsen or fail to improve, for at any time for any worsening symptoms, Go to Emergency room/ urgent care if worse. Bring log of blood pressure to next visit and send readings  to Mychart.   RED flags discussed.  IWellington Hampshire Mamoudou Mulvehill, FNP, have reviewed all documentation for this visit. The documentation on 01/01/20 for the exam, diagnosis, procedures, and orders are all accurate and complete.  The entirety of the information documented in the History of Present Illness, Review of Systems and Physical Exam were personally obtained by me. Portions of this information were initially documented by the  Certified Medical Assistant whose name is documented in [place] and reviewed by me for thoroughness and accuracy.  I have personally performed the exam and reviewed the chart and it is accurate to the best of my knowledge.  Haematologist has been used and any errors in dictation or transcription are unintentional.  Kelby Aline. [place], [place] [phone] (phone) [phone] (fax)  [place]

## 2020-01-01 NOTE — Patient Instructions (Addendum)
Call office if blood pressure readings over 130/80 and or any headaches or hypertension. Keep log.   Eating Plan After Bariatric Surgery, Stage 1 A diet after weight loss surgery (bariatric surgery) should provide plenty of fluids and nutrients while promoting weight loss and healing. Each stage of recovery has a different set of food and drink recommendations. Your health care provider may recommend that you work with a diet and nutrition specialist (dietitian) to make a staged eating plan that is right for you. Stage 1 begins right after surgery and lasts until about 2 weeks after surgery, or as long as directed by your health care provider. During this time, you will eat a liquid-only diet. You will be on clear liquids immediately after surgery. After your health care provider approves, you will move on to full liquids. What are tips for following this plan?  Meal planning  Eat at set times. Allow 30-45 minutes for each meal.  Have protein with every meal. Eat protein foods first. Protein is a very important nutrient after surgery.  You will need at least 60-80 g of protein daily, or as much as determined by your dietitian. Work with your dietitian to choose a liquid protein supplement that has: ? At least 15 g of protein per 8 oz serving. ? Less than 20 g total carbohydrate per 8 oz serving. ? Less than 5 g fat per 8 oz serving.  To get more protein, you may add 1 Tbsp non-fat dry milk powder to each  cup of skim milk.  Do not eat or drink: ? Carbonated beverages. ? Sweets with more than 25 g of sugar per serving. ? High-fat foods. This includes foods with more than 5 g of fat per serving. ? Alcohol. ? Any foods you do not tolerate well, such as dairy or high-fiber foods.  Move on to a soft-food diet when your health care provider approves. For most people, this happens after 2 weeks on a liquid-only diet. General instructions  Sip liquids. Do not use a straw until several weeks  after surgery.  Do not drink extra liquids with meals for 30 minutes before or after meals.  Slowly sip 8-10 oz of clear liquid, preferably water, between each meal. Try to get at least 48-60 oz of fluid each day.  Take a liquid or chewable multivitamin with iron each day. Discuss additional supplements with your health care provider or dietitian.  Stop eating when you feel full.  Your surgeon or dietitian may have specific eating guidelines for you. This may depend on: ? The type of weight loss surgery you had. ? Your overall health. Clear liquids It is important to drink clear liquids to make sure you stay hydrated. Try to drink at least 24-30 oz (about 3 cups) of clear liquids each day. Clear liquids have:  No carbonation.  No sugar or calories.  No caffeine or alcohol. Clear liquids include:  Water and flavored water.  Decaffeinated coffee or tea.  "Light" powdered juice mixes, like lemonade.  Clear broth.  "Light" flavored gelatin.  Sugar-free popsicles. Full liquids Full liquids are important for making sure you have enough calories, protein, and other nutrients. Try to get at least 24-30 oz (about 3 cups) of full liquids each day. Full liquids include:  Low-fat or fat-free milk.  Soy milk.  Yogurt.  Protein shakes or protein powder.  "Light" meal replacement shakes.  Sugar-free pudding. Summary  Stage 1 begins right after surgery and lasts until about 2  weeks after surgery, or as directed by your health care provider.  Stage 1 diet is a liquid-only diet. You will be on clear liquids immediately after surgery. After your health care provider approves, you will move to full liquids.  Eating enough protein and drinking plenty of fluids are important in promoting weight loss and healing after surgery. This information is not intended to replace advice given to you by your health care provider. Make sure you discuss any questions you have with your health care  provider. Document Revised: 12/13/2018 Document Reviewed: 11/21/2016 Elsevier Patient Education  2020 [employer]. Managing Your Hypertension Hypertension is commonly called high blood pressure. This is when the force of your blood pressing against the walls of your arteries is too strong. Arteries are blood vessels that carry blood from your heart throughout your body. Hypertension forces the heart to work harder to pump blood, and may cause the arteries to become narrow or stiff. Having untreated or uncontrolled hypertension can cause heart attack, stroke, kidney disease, and other problems. What are blood pressure readings? A blood pressure reading consists of a higher number over a lower number. Ideally, your blood pressure should be below 120/80. The first ("top") number is called the systolic pressure. It is a measure of the pressure in your arteries as your heart beats. The second ("bottom") number is called the diastolic pressure. It is a measure of the pressure in your arteries as the heart relaxes. What does my blood pressure reading mean? Blood pressure is classified into four stages. Based on your blood pressure reading, your health care provider may use the following stages to determine what type of treatment you need, if any. Systolic pressure and diastolic pressure are measured in a unit called mm Hg. Normal  Systolic pressure: below 120.  Diastolic pressure: below 80. Elevated  Systolic pressure: 120-129.  Diastolic pressure: below 80. Hypertension stage 1  Systolic pressure: 130-139.  Diastolic pressure: 80-89. Hypertension stage 2  Systolic pressure: 140 or above.  Diastolic pressure: 90 or above. What health risks are associated with hypertension? Managing your hypertension is an important responsibility. Uncontrolled hypertension can lead to:  A heart attack.  A stroke.  A weakened blood vessel (aneurysm).  Heart failure.  Kidney damage.  Eye  damage.  Metabolic syndrome.  Memory and concentration problems. What changes can I make to manage my hypertension? Hypertension can be managed by making lifestyle changes and possibly by taking medicines. Your health care provider will help you make a plan to bring your blood pressure within a normal range. Eating and drinking   Eat a diet that is high in fiber and potassium, and low in salt (sodium), added sugar, and fat. An example eating plan is called the DASH (Dietary Approaches to Stop Hypertension) diet. To eat this way: ? Eat plenty of fresh fruits and vegetables. Try to fill half of your plate at each meal with fruits and vegetables. ? Eat whole grains, such as whole wheat pasta, brown rice, or whole grain bread. Fill about one quarter of your plate with whole grains. ? Eat low-fat diary products. ? Avoid fatty cuts of meat, processed or cured meats, and poultry with skin. Fill about one quarter of your plate with lean proteins such as fish, chicken without skin, beans, eggs, and tofu. ? Avoid premade and processed foods. These tend to be higher in sodium, added sugar, and fat.  Reduce your daily sodium intake. Most people with hypertension should eat less than 1,500  mg of sodium a day.  Limit alcohol intake to no more than 1 drink a day for nonpregnant women and 2 drinks a day for men. One drink equals 12 oz of beer, 5 oz of wine, or 1 oz of hard liquor. Lifestyle  Work with your health care provider to maintain a healthy body weight, or to lose weight. Ask what an ideal weight is for you.  Get at least 30 minutes of exercise that causes your heart to beat faster (aerobic exercise) most days of the week. Activities may include walking, swimming, or biking.  Include exercise to strengthen your muscles (resistance exercise), such as weight lifting, as part of your weekly exercise routine. Try to do these types of exercises for 30 minutes at least 3 days a week.  Do not use any  products that contain nicotine or tobacco, such as cigarettes and e-cigarettes. If you need help quitting, ask your health care provider.  Control any long-term (chronic) conditions you have, such as high cholesterol or diabetes. Monitoring  Monitor your blood pressure at home as told by your health care provider. Your personal target blood pressure may vary depending on your medical conditions, your age, and other factors.  Have your blood pressure checked regularly, as often as told by your health care provider. Working with your health care provider  Review all the medicines you take with your health care provider because there may be side effects or interactions.  Talk with your health care provider about your diet, exercise habits, and other lifestyle factors that may be contributing to hypertension.  Visit your health care provider regularly. Your health care provider can help you create and adjust your plan for managing hypertension. Will I need medicine to control my blood pressure? Your health care provider may prescribe medicine if lifestyle changes are not enough to get your blood pressure under control, and if:  Your systolic blood pressure is 130 or higher.  Your diastolic blood pressure is 80 or higher. Take medicines only as told by your health care provider. Follow the directions carefully. Blood pressure medicines must be taken as prescribed. The medicine does not work as well when you skip doses. Skipping doses also puts you at risk for problems. Contact a health care provider if:  You think you are having a reaction to medicines you have taken.  You have repeated (recurrent) headaches.  You feel dizzy.  You have swelling in your ankles.  You have trouble with your vision. Get help right away if:  You develop a severe headache or confusion.  You have unusual weakness or numbness, or you feel faint.  You have severe pain in your chest or abdomen.  You vomit  repeatedly.  You have trouble breathing. Summary  Hypertension is when the force of blood pumping through your arteries is too strong. If this condition is not controlled, it may put you at risk for serious complications.  Your personal target blood pressure may vary depending on your medical conditions, your age, and other factors. For most people, a normal blood pressure is less than 120/80.  Hypertension is managed by lifestyle changes, medicines, or both. Lifestyle changes include weight loss, eating a healthy, low-sodium diet, exercising more, and limiting alcohol. This information is not intended to replace advice given to you by your health care provider. Make sure you discuss any questions you have with your health care provider. Document Revised: 12/14/2018 Document Reviewed: 07/20/2016 Elsevier Patient Education  2020 [employer]. Blood  Pressure Record Sheet To take your blood pressure, you will need a blood pressure machine. You can buy a blood pressure machine (blood pressure monitor) at your clinic, drug store, or online. When choosing one, consider:  An automatic monitor that has an arm cuff.  A cuff that wraps snugly around your upper arm. You should be able to fit only one finger between your arm and the cuff.  A device that stores blood pressure reading results.  Do not choose a monitor that measures your blood pressure from your wrist or finger. Follow your health care provider's instructions for how to take your blood pressure. To use this form:  Get one reading in the morning (a.m.) before you take any medicines.  Get one reading in the evening (p.m.) before supper.  Take at least 2 readings with each blood pressure check. This makes sure the results are correct. Wait 1-2 minutes between measurements.  Write down the results in the spaces on this form.  Repeat this once a week, or as told by your health care provider.  Make a follow-up appointment with your  health care provider to discuss the results. Blood pressure log Date: _______________________  a.m. _____________________(1st reading) _____________________(2nd reading)  p.m. _____________________(1st reading) _____________________(2nd reading) Date: _______________________  a.m. _____________________(1st reading) _____________________(2nd reading)  p.m. _____________________(1st reading) _____________________(2nd reading) Date: _______________________  a.m. _____________________(1st reading) _____________________(2nd reading)  p.m. _____________________(1st reading) _____________________(2nd reading) Date: _______________________  a.m. _____________________(1st reading) _____________________(2nd reading)  p.m. _____________________(1st reading) _____________________(2nd reading) Date: _______________________  a.m. _____________________(1st reading) _____________________(2nd reading)  p.m. _____________________(1st reading) _____________________(2nd reading) This information is not intended to replace advice given to you by your health care provider. Make sure you discuss any questions you have with your health care provider. Document Revised: 10/20/2017 Document Reviewed: 08/22/2017 Elsevier Patient Education  2020 Else[employer] Inc. Carvedilol Tablets What is this medicine? CARVEDILOL (KAR ve dil ol) is a beta blocker. It decreases the amount of work your heart has to do and helps your heart beat regularly. It treats high blood pressure. This medicine may be used for other purposes; ask your health care provider or pharmacist if you have questions. COMMON BRAND NAME(S): Coreg What should I tell my health care provider before I take this medicine? They need to know if you have any of these conditions:  circulation problems  diabetes  history of heart attack or heart disease  liver disease  lung or breathing disease, like asthma or emphysema  pheochromocytoma  slow or  irregular heartbeat  thyroid disease  an unusual or allergic reaction to carvedilol, other beta-blockers, medicines, foods, dyes, or preservatives  pregnant or trying to get pregnant  breast-feeding How should I use this medicine? Take this drug by mouth. Take it as directed on the prescription label at the same time every day. Take it with food. Keep taking it unless your health care provider tells you to stop. Talk to your health care provider about the use of this drug in children. Special care may be needed. Overdosage: If you think you have taken too much of this medicine contact a poison control center or emergency room at once. NOTE: This medicine is only for you. Do not share this medicine with others. What if I miss a dose? If you miss a dose, take it as soon as you can. If it is almost time for your next dose, take only that dose. Do not take double or extra doses. What may interact with this medicine? This medicine may  interact with the following medications:  certain medicines for blood pressure, heart disease, irregular heart beat  certain medicines for depression, like fluoxetine or paroxetine  certain medicines for diabetes, like glipizide or glyburide  cimetidine  clonidine  cyclosporine  digoxin  MAOIs like Carbex, Eldepryl, Marplan, Nardil, and Parnate  reserpine  rifampin This list may not describe all possible interactions. Give your health care provider a list of all the medicines, herbs, non-prescription drugs, or dietary supplements you use. Also tell them if you smoke, drink alcohol, or use illegal drugs. Some items may interact with your medicine. What should I watch for while using this medicine? Check your heart rate and blood pressure regularly while you are taking this medicine. Ask your doctor or health care professional what your heart rate and blood pressure should be, and when you should contact him or her. Do not stop taking this medicine  suddenly. This could lead to serious heart-related effects. Contact your doctor or health care professional if you have difficulty breathing while taking this drug. Check your weight daily. Ask your doctor or health care professional when you should notify him/her of any weight gain. You may get drowsy or dizzy. Do not drive, use machinery, or do anything that requires mental alertness until you know how this medicine affects you. To reduce the risk of dizzy or fainting spells, do not sit or stand up quickly. Alcohol can make you more drowsy, and increase flushing and rapid heartbeats. Avoid alcoholic drinks. This medicine may increase blood sugar. Ask your healthcare provider if changes in diet or medicines are needed if you have diabetes. If you are going to have surgery, tell your doctor or health care professional that you are taking this medicine. What side effects may I notice from receiving this medicine? Side effects that you should report to your doctor or health care professional as soon as possible:  allergic reactions like skin rash, itching or hives, swelling of the face, lips, or tongue  breathing problems  dark urine  irregular heartbeat   signs and symptoms of high blood sugar such as being more thirsty or hungry or having to urinate more than normal. You may also feel very tired or have blurry vision.  swollen legs or ankles  vomiting  yellowing of the eyes or skin Side effects that usually do not require medical attention (report to your doctor or health care professional if they continue or are bothersome):  change in sex drive or performance  diarrhea  dry eyes (especially if wearing contact lenses)  dry, itching skin  headache  nausea  unusually tired This list may not describe all possible side effects. Call your doctor for medical advice about side effects. You may report side effects to FDA at 1-800-FDA-1088. Where should I keep my medicine? Keep out  of the reach of children and pets. Store at room temperature between 20 and 25 degrees C (68 and 77 degrees F). Protect from moisture. Keep the container tightly closed. Throw away any unused drug after the expiration date. NOTE: This sheet is a summary. It may not cover all possible information. If you have questions about this medicine, talk to your doctor, pharmacist, or health care provider.  2020 Elsevier/Gold Standard (2019-03-29 17:42:09)

## 2020-01-06 ENCOUNTER — Telehealth: Payer: BC Managed Care – PPO | Admitting: Dietician

## 2020-01-06 ENCOUNTER — Ambulatory Visit: Payer: BC Managed Care – PPO | Admitting: Dietician

## 2020-01-10 ENCOUNTER — Encounter: Payer: Self-pay | Admitting: Dietician

## 2020-01-10 ENCOUNTER — Encounter: Payer: BC Managed Care – PPO | Attending: Surgery | Admitting: Dietician

## 2020-01-10 ENCOUNTER — Other Ambulatory Visit: Payer: Self-pay

## 2020-01-10 VITALS — Ht 67.0 in | Wt 237.0 lb

## 2020-01-10 DIAGNOSIS — Z8249 Family history of ischemic heart disease and other diseases of the circulatory system: Secondary | ICD-10-CM | POA: Insufficient documentation

## 2020-01-10 DIAGNOSIS — I10 Essential (primary) hypertension: Secondary | ICD-10-CM | POA: Insufficient documentation

## 2020-01-10 DIAGNOSIS — Z713 Dietary counseling and surveillance: Secondary | ICD-10-CM | POA: Insufficient documentation

## 2020-01-10 DIAGNOSIS — Z6841 Body Mass Index (BMI) 40.0 and over, adult: Secondary | ICD-10-CM | POA: Insufficient documentation

## 2020-01-10 DIAGNOSIS — Z833 Family history of diabetes mellitus: Secondary | ICD-10-CM | POA: Insufficient documentation

## 2020-01-10 DIAGNOSIS — E669 Obesity, unspecified: Secondary | ICD-10-CM

## 2020-01-10 DIAGNOSIS — Z79899 Other long term (current) drug therapy: Secondary | ICD-10-CM | POA: Insufficient documentation

## 2020-01-10 DIAGNOSIS — Z6837 Body mass index (BMI) 37.0-37.9, adult: Secondary | ICD-10-CM | POA: Diagnosis not present

## 2020-01-10 DIAGNOSIS — Z880 Allergy status to penicillin: Secondary | ICD-10-CM | POA: Insufficient documentation

## 2020-01-10 DIAGNOSIS — Z8632 Personal history of gestational diabetes: Secondary | ICD-10-CM | POA: Insufficient documentation

## 2020-01-10 NOTE — Progress Notes (Signed)
Follow-up visit:  2 Weeks Post-Operative Sleeve Gastrectomy Surgery  Medical Nutrition Therapy:  Appt start time: 1200 end time:  1230.  Primary concerns today: Post-operative Bariatric Surgery Nutrition Management.   Learning Readiness:   Change in progress  Weight: 237lbs pt reported Height: 5'7" Weight at previous NDES visit: 266lbs 11/22/19  Progress:  Weight loss of about 29lbs since surgery.  Patient reports some diarrhea for 1.5 weeks after surgery, but is now resolving  She is unable to tolerate bariatric chewable vitamins, vomited after trying to consume.  She feels she in not taking in enough fluids, as she is urinating less often during the day  Patient reports readiness to try solid protein foods, foods without sweet taste.     Dietary recall: 1.5 protein shakes daily, including Premier Protein (30g) or Magazine features editor (26g) 1/2 container yogurt daily Small portion low fat cream of mushroom soup daily  Fluid intake: water, sugar free Gatorade, protein shakes, soup -- <60oz per patient Estimated total protein intake: 45-55grams  Medications: carvedilol, pantoprazole Supplementation: calcium 500mg  2x daily  Using straws: no Drinking while eating: no Hair loss: no Carbonated beverages: no N/V/D/C: no n/v (except after taking bari MVI); diarrhea for 1.5 weeks after surgery, gradually improving Dumping syndrome: no   Recent physical activity:  Walking with daughter daily  Progress Towards Goal(s):  In progress.  Handouts given during visit include:  Phase 3, 4 bariatric diet handouts (sent by email)   Nutritional Diagnosis:  [place]-3.3 Overweight/obesity As related to history of excess calories and inadequate physical activity.  As evidenced by patient with current BMI of 37.1, following bariatric diet after bariatric surgery.    Intervention:    Reviewed progress since surgery.  Discussed alternatives for bariatric formula multivitamin due to  intolerance.  Instructed on advancement of diet to include solid protein foods; advised to monitor intake and continue working to achieve 60g protein goal. Discussed options for supplemental protein  Reviewed importance of avoiding fluids immediately before, during, and soon after eating, and importance of ongoing effort between meals to consume adequate fluid amounts.   Teaching Method Utilized:  Visual Auditory   Barriers to learning/adherence to lifestyle change: none  Demonstrated degree of understanding via:  Teach Back   Monitoring/Evaluation:  Dietary intake, exercise, and body weight. Follow up in 6weeks for 2 month post-op visit.

## 2020-01-10 NOTE — Patient Instructions (Signed)
   Begin adding solid protein foods, tender and moistly cooked, in 1oz or 1/4 cup portions. Allow 15-30 minutes to eat. Avoid fluids 15 min before, during and 30 min after eating.   OK to try pureed low carb veg with broth and added unflavored protein powder.   Try Flintstones Complete multivitamin, 3-4 per day, and add at least 18mg  iron and vitamin B12.

## 2020-01-21 ENCOUNTER — Ambulatory Visit: Payer: BC Managed Care – PPO | Admitting: Cardiovascular Disease

## 2020-01-31 ENCOUNTER — Ambulatory Visit: Payer: Self-pay | Admitting: Adult Health

## 2020-02-05 ENCOUNTER — Telehealth: Payer: Self-pay | Admitting: Dietician

## 2020-02-05 NOTE — Telephone Encounter (Signed)
Called patient per her request to answer nutrition questions. She reports weight loss of 7lbs since previous visit on 01/15/20 (weight check only), and is uncertain whether that rate of weight loss is normal. Assured her that some inconsistency with weight loss rate is indeed normal and discussed factors that can influence weight loss day-to-day. Addressed question regarding managing constipation and advised including 60+oz fluids daily, gradually adding cooked low-carb vegetables for some fiber, and getting advice for recommended medicinal aid from surgeon.  Patient has stopped protein shakes and is getting all protein from solid foods at this time.  Scheduled 40-month MNT visit for 02/26/20.

## 2020-02-26 ENCOUNTER — Encounter: Payer: Self-pay | Admitting: Dietician

## 2020-02-26 ENCOUNTER — Ambulatory Visit (INDEPENDENT_AMBULATORY_CARE_PROVIDER_SITE_OTHER): Payer: BC Managed Care – PPO | Admitting: Dietician

## 2020-02-26 VITALS — Ht 67.0 in | Wt 221.0 lb

## 2020-02-26 DIAGNOSIS — E669 Obesity, unspecified: Secondary | ICD-10-CM | POA: Diagnosis not present

## 2020-02-26 DIAGNOSIS — Z6834 Body mass index (BMI) 34.0-34.9, adult: Secondary | ICD-10-CM

## 2020-02-26 NOTE — Patient Instructions (Signed)
   Try black and/or green tea with a small amount of sugar free sweetener; or try adding 1/2 individual packet of crystal light into 16oz or more water for lighter flavor; or add pieces of fruit into plain water to infuse flavor.   Continue to increase low-carb vegetables as long as you are able to eat at least 2oz of protein food first (14grams protein). OK to have salad but avoid large pieces of raw carrots and nuts.   Avoid peanut butter/ nut butters until 6 months post-op.   Great job tracking intake and increasing activity! Keep it up!

## 2020-02-26 NOTE — Progress Notes (Signed)
Follow-up visit:  8 Weeks Post-Operative Sleeve Gastrectomy Surgery  Medical Nutrition Therapy:  Appt start time: 1430 end time:  1500.  Primary concerns today: Post-operative Bariatric Surgery Nutrition Management.   Learning Readiness:   Change in progress  Weight: 221lbs Height: 5'7" Weight at previous NDES visit: 237lbs  Progress:  Weight loss of 16lbs since 01/10/20. Weight has been self-measured by patient at home for virtual visits; therefore no body composition measurement taken.  Patient reports some struggle with taking in adequate fluids in recently days, finds herself growing weary of sweet tasting drinks.   She is tracking her food intake and achieving 600-700kcal daily and consistently reaching the 60g protein goal.   She has had some constipation recently and is taking milk of magnesia and has used suppository twice.      Dietary recall: Breakfast: Premier protein shake Snack: cheese stick  Lunch: lentils; hummus; walnuts (tolerated well) Snack: same as am  Dinner: protein source (usually meat) + cooked low-carb vegetables Snack:   Fluid intake: about 40oz daily for past 2 days Estimated total protein intake: >60g daily  Medications: carvedilol, pantoprazole Supplementation: Flintstones multivitamin 3x daily + 9mg  iron 1x daily + vitamin B12 1x daily + 500mg  calcium citrate 2x daily  Using straws: no Drinking while eating: no Hair loss: yes Carbonated beverages: no N/V/D/C: constipation Dumping syndrome: no   Recent physical activity:  Chair exercises and (light) weight training daily + walking 30-45 minutes 2 times a week  Progress Towards Goal(s):  In progress.     Nutritional Diagnosis:  [place]-3.3 Overweight/obesity As related to history of excess calories and inadequate physical activity.  As evidenced by patient with current BMI of 34.6, following bariatric diet guidelines for ongoing weight loss after sleeve gastrectomy.    Intervention:     Reviewed progress since previous visit.  Discussed options for increasing variety of fluids, such as tea/ green tea with small amount of sugar free sweetener, adding 1/2 of sugar free flavoring packet to bottle/ 16oz water.  Advised waiting until closer to 6 months post-op to add nuts/ peanut butter and discussed fat content and texture as issues.  Instructed on advancement of diet to include more vegetables and gradually adding some fruits towards the 43-months post-op time.  Discussed possible causes of hair loss and importance of adequate protein as well as adhering to vitamin supplement recommendations to minimize hair loss.   Reviewed importance of regular physical activity and portion control, low fat and low sugar food choices in long-term weight loss success. Also discussed factors affecting hunger and cravings and controlling these issues long-term.  Teaching Method Utilized:  Visual Auditory  Barriers to learning/adherence to lifestyle change: none  Demonstrated degree of understanding via:  Teach Back   Monitoring/Evaluation:  Dietary intake, exercise, and body weight.     Follow up in 4 months for 6 month post-op visit.

## 2020-03-16 ENCOUNTER — Encounter: Payer: Self-pay | Admitting: Adult Health

## 2020-03-20 ENCOUNTER — Other Ambulatory Visit: Payer: Self-pay | Admitting: Adult Health

## 2020-03-20 DIAGNOSIS — R7401 Elevation of levels of liver transaminase levels: Secondary | ICD-10-CM

## 2020-03-20 DIAGNOSIS — L659 Nonscarring hair loss, unspecified: Secondary | ICD-10-CM | POA: Insufficient documentation

## 2020-03-20 DIAGNOSIS — Z9884 Bariatric surgery status: Secondary | ICD-10-CM

## 2020-03-20 DIAGNOSIS — E559 Vitamin D deficiency, unspecified: Secondary | ICD-10-CM | POA: Insufficient documentation

## 2020-03-20 NOTE — Progress Notes (Signed)
Lab orders  for hair loss scanned to Mychart she was going for labs already added on hair loss labs/ Follow up after labs in office. Recent gastric sleeve in April, hair loss post surgery will check labs to rule out cause.

## 2020-03-23 ENCOUNTER — Other Ambulatory Visit: Payer: Self-pay

## 2020-03-23 ENCOUNTER — Telehealth: Payer: Self-pay

## 2020-03-23 DIAGNOSIS — I10 Essential (primary) hypertension: Secondary | ICD-10-CM

## 2020-03-23 DIAGNOSIS — Z9884 Bariatric surgery status: Secondary | ICD-10-CM

## 2020-03-23 DIAGNOSIS — L659 Nonscarring hair loss, unspecified: Secondary | ICD-10-CM

## 2020-03-23 DIAGNOSIS — E559 Vitamin D deficiency, unspecified: Secondary | ICD-10-CM

## 2020-03-23 DIAGNOSIS — R7401 Elevation of levels of liver transaminase levels: Secondary | ICD-10-CM

## 2020-03-23 NOTE — Telephone Encounter (Signed)
Thank you :)

## 2020-03-23 NOTE — Telephone Encounter (Signed)
Attempted to contact patient, from what I see when I select letters in patient chart it states that letter was sent. I still have original script you wrote with lab orders as well. Left message for patient letting her know she can pick up original document at front desk. KW

## 2020-03-23 NOTE — Telephone Encounter (Signed)
Patient called office back again and states that she needs lab orders ordered in her chart so she can go to [facility] to have labs drawn, will place in future orders to be released. KW

## 2020-03-23 NOTE — Telephone Encounter (Signed)
Copied from CRM [id number]. Topic: General - Other >> Mar 23, 2020 12:12 PM Laural Benes, [place] C wrote: Reason for CRM: pt called back into speak with Nicholos Johns. Pt says that she is unable to print out orders via mychart, pt says that orders are also not signed. Pt would like further assistance.    CB:[phone]

## 2020-03-23 NOTE — Addendum Note (Signed)
Addended by: Fonda Kinder on: 03/23/2020 04:41 PM   Modules accepted: Orders

## 2020-03-28 DIAGNOSIS — E559 Vitamin D deficiency, unspecified: Secondary | ICD-10-CM | POA: Diagnosis not present

## 2020-03-28 DIAGNOSIS — I10 Essential (primary) hypertension: Secondary | ICD-10-CM | POA: Diagnosis not present

## 2020-03-28 DIAGNOSIS — Z9884 Bariatric surgery status: Secondary | ICD-10-CM | POA: Diagnosis not present

## 2020-03-28 DIAGNOSIS — L659 Nonscarring hair loss, unspecified: Secondary | ICD-10-CM | POA: Diagnosis not present

## 2020-03-28 DIAGNOSIS — R7401 Elevation of levels of liver transaminase levels: Secondary | ICD-10-CM | POA: Diagnosis not present

## 2020-04-02 LAB — VITAMIN D 25 HYDROXY (VIT D DEFICIENCY, FRACTURES): Vit D, 25-Hydroxy: 33.3 ng/mL (ref 30.0–100.0)

## 2020-04-02 LAB — IRON AND TIBC
Iron Saturation: 21 % (ref 15–55)
Iron: 68 ug/dL (ref 27–159)
Total Iron Binding Capacity: 331 ug/dL (ref 250–450)
UIBC: 263 ug/dL (ref 131–425)

## 2020-04-02 LAB — COMPREHENSIVE METABOLIC PANEL
ALT: 32 IU/L (ref 0–32)
AST: 18 IU/L (ref 0–40)
Albumin/Globulin Ratio: 2 (ref 1.2–2.2)
Albumin: 4.4 g/dL (ref 3.8–4.8)
Alkaline Phosphatase: 53 IU/L (ref 48–121)
BUN/Creatinine Ratio: 15 (ref 9–23)
BUN: 10 mg/dL (ref 6–20)
Bilirubin Total: 0.3 mg/dL (ref 0.0–1.2)
CO2: 23 mmol/L (ref 20–29)
Calcium: 9.4 mg/dL (ref 8.7–10.2)
Chloride: 102 mmol/L (ref 96–106)
Creatinine, Ser: 0.65 mg/dL (ref 0.57–1.00)
GFR calc Af Amer: 130 mL/min/{1.73_m2} (ref >59)
GFR calc non Af Amer: 113 mL/min/{1.73_m2} (ref >59)
Globulin, Total: 2.2 g/dL (ref 1.5–4.5)
Glucose: 93 mg/dL (ref 65–99)
Potassium: 4.2 mmol/L (ref 3.5–5.2)
Sodium: 138 mmol/L (ref 134–144)
Total Protein: 6.6 g/dL (ref 6.0–8.5)

## 2020-04-02 LAB — CBC WITH DIFFERENTIAL/PLATELET
Basophils Absolute: 0 10*3/uL (ref 0.0–0.2)
Basos: 0 %
EOS (ABSOLUTE): 0.2 10*3/uL (ref 0.0–0.4)
Eos: 3 %
Hematocrit: 41.5 % (ref 34.0–46.6)
Hemoglobin: 13.7 g/dL (ref 11.1–15.9)
Immature Grans (Abs): 0 10*3/uL (ref 0.0–0.1)
Immature Granulocytes: 0 %
Lymphocytes Absolute: 1.8 10*3/uL (ref 0.7–3.1)
Lymphs: 32 %
MCH: 29.5 pg (ref 26.6–33.0)
MCHC: 33 g/dL (ref 31.5–35.7)
MCV: 89 fL (ref 79–97)
Monocytes Absolute: 0.4 10*3/uL (ref 0.1–0.9)
Monocytes: 8 %
Neutrophils Absolute: 3.2 10*3/uL (ref 1.4–7.0)
Neutrophils: 57 %
Platelets: 255 10*3/uL (ref 150–450)
RBC: 4.65 x10E6/uL (ref 3.77–5.28)
RDW: 12.5 % (ref 11.7–15.4)
WBC: 5.7 10*3/uL (ref 3.4–10.8)

## 2020-04-02 LAB — TSH+T4F+T3FREE
Free T4: 1.22 ng/dL (ref 0.82–1.77)
T3, Free: 2.9 pg/mL (ref 2.0–4.4)
TSH: 1.18 u[IU]/mL (ref 0.450–4.500)

## 2020-04-02 LAB — FSH/LH
FSH: 7.5 m[IU]/mL
LH: 3.7 m[IU]/mL

## 2020-04-02 LAB — PROLACTIN: Prolactin: 13.4 ng/mL (ref 4.8–23.3)

## 2020-04-02 LAB — FERRITIN, SERUM (SERIAL): Ferritin: 50 ng/mL (ref 15–150)

## 2020-04-02 LAB — VITAMIN B12: Vitamin B-12: 1732 pg/mL — ABNORMAL HIGH (ref 232–1245)

## 2020-04-02 LAB — RPR: RPR Ser Ql: NONREACTIVE

## 2020-04-02 LAB — TESTOSTERONE: Testosterone: 13 ng/dL (ref 8–60)

## 2020-04-02 LAB — DHEA: Dehydroepiandrosterone: 151 ng/dL (ref 31–701)

## 2020-04-09 ENCOUNTER — Telehealth: Payer: Self-pay | Admitting: Dietician

## 2020-04-09 NOTE — Telephone Encounter (Signed)
Returned message from patient with question on how best to measure food portions, whether by weight or volume. Left a message and relayed with liquid vs solid measuring and volume vs weight. Encouraged patient to use whichever method works best for her to ensure adequate nutritional intake without excess. Encouraged her to call back with any additional questions.

## 2020-06-13 DIAGNOSIS — F410 Panic disorder [episodic paroxysmal anxiety] without agoraphobia: Secondary | ICD-10-CM | POA: Diagnosis not present

## 2020-06-13 DIAGNOSIS — Z130 Encounter for screening for diseases of the blood and blood-forming organs and certain disorders involving the immune mechanism: Secondary | ICD-10-CM | POA: Diagnosis not present

## 2020-06-13 DIAGNOSIS — Z8639 Personal history of other endocrine, nutritional and metabolic disease: Secondary | ICD-10-CM | POA: Diagnosis not present

## 2020-06-13 DIAGNOSIS — Z903 Acquired absence of stomach [part of]: Secondary | ICD-10-CM | POA: Diagnosis not present

## 2020-06-13 DIAGNOSIS — Z862 Personal history of diseases of the blood and blood-forming organs and certain disorders involving the immune mechanism: Secondary | ICD-10-CM | POA: Diagnosis not present

## 2020-06-13 DIAGNOSIS — F411 Generalized anxiety disorder: Secondary | ICD-10-CM | POA: Diagnosis not present

## 2020-06-13 DIAGNOSIS — F331 Major depressive disorder, recurrent, moderate: Secondary | ICD-10-CM | POA: Diagnosis not present

## 2020-07-10 ENCOUNTER — Other Ambulatory Visit: Payer: Self-pay

## 2020-07-10 ENCOUNTER — Encounter: Payer: BC Managed Care – PPO | Attending: Surgery | Admitting: Dietician

## 2020-07-10 ENCOUNTER — Encounter: Payer: Self-pay | Admitting: Dietician

## 2020-07-10 VITALS — Ht 67.0 in | Wt 203.1 lb

## 2020-07-10 DIAGNOSIS — E669 Obesity, unspecified: Secondary | ICD-10-CM | POA: Diagnosis not present

## 2020-07-10 DIAGNOSIS — Z6831 Body mass index (BMI) 31.0-31.9, adult: Secondary | ICD-10-CM

## 2020-07-10 NOTE — Progress Notes (Signed)
Nutrition Therapy for Post-Operative Bariatric Diet Follow-up visit:  6 months post-op Sleeve gastrectomy Surgery  Medical Nutrition Therapy:  Appt start time: 1415 end time:  1500.  Anthropometrics: Weight: 203.1lbs Height: 5'7"  Date 07/10/20 01/15/20 11/22/19  BMI 31.8 37.1 41.7  Weight (lbs) 203.1 237.9 266.4  Skeletal muscle (lbs) 70.5 68.6 75.0  % body fat 38.2 48.3 50.2   Clinical: Medications:  reconciled list in medical record Supplementation: flintstones 2-3 multivitamin 2x daily + calcium 2x daily Health/ medical history changes: no changes per patient GI symptoms: N/V/D/C: constipation has improved recently; no other symptoms Dumping Syndrome: no Hair loss: yes, gradually improving, using rogaine  Dietary/ Lifestyle Progress: . Patient reports doing well overall; she recognizes that she is physically unable to eat much even when feeling very hungry, so she is reconciling mental vs physical hunger, and making sure to eat at regular intervals. . Weight loss of 34.8lbs since previous in-office measurement.   She continues to track intake, and is now averaging about 1100kcal daily.   Dietary recall: Eating pattern: 3 meals and 2 snack daily Dining out: occasional Breakfast: 1.5 eggs (unable to finish 2) Snack: babybel cheese  Lunch: deli meat roll-up; leftover chicken Snack: babybel cheese; 1/2 protein shake  Dinner: meat + veg Snack: usually none; occasionally sugar free popsicle; handful of nuts  Fluid intake: water with sugar free flavoring; coffee; protein shake-- >64oz Estimated total protein intake: 60-65grams Bariatric diet adherence:  . Using straws: yes, denies any issues . Drinking fluids during meals: no . Carbonated beverages: no  Recent physical activity:  Increased walking hand weight exercise during workday, takes steps often   Nutrition Intervention:   . Reviewed progress since previous visit 02/2020 . Commended patient for adherence to bariatric  diet and progress made. . Discussed vitamin and mineral supplementation, advised 3-4 flintstones vitamins daily to meet needs. . Instructed on advancement of diet to include some lower-glycemic starchy vegetables and fruits over the upcoming months; reviewed importance of eating protein first, followed by low-carb veg, then other foods.    Nutritional Diagnosis:  [place]-3.3 Overweight/obesity As related to history of excess calories and inadequate physical activity.  As evidenced by patient with current BMI of 31.8, following bariatric diet closely for ongoing weight loss after sleeve gastrectomy.  Teaching Method Utilized:  Visual Auditory Hands on  Materials provided:  Phase 5 and 6 bariatric diet handouts  Learning Readiness:   Change in progress  Barriers to learning/adherence to lifestyle change: none  Demonstrated degree of understanding via:  Teach Back      Plan: . Patient to continue gradual dietary advancement.  . Return for 9 month post-op visit on 10/09/20 at 2:30pm

## 2020-07-10 NOTE — Patient Instructions (Signed)
   Continue with regular meals and snacks emphasizing lean proteins and low-carb vegetables.  Gradually add high fiber starchy vegetables and/or fruits.  Great job following diet guidelines and increasing physical activity! Keep it up!

## 2020-07-19 DIAGNOSIS — Z0000 Encounter for general adult medical examination without abnormal findings: Secondary | ICD-10-CM | POA: Diagnosis not present

## 2020-09-04 DIAGNOSIS — Z03818 Encounter for observation for suspected exposure to other biological agents ruled out: Secondary | ICD-10-CM | POA: Diagnosis not present

## 2020-09-04 DIAGNOSIS — Z20822 Contact with and (suspected) exposure to covid-19: Secondary | ICD-10-CM | POA: Diagnosis not present

## 2020-10-09 ENCOUNTER — Encounter: Payer: BC Managed Care – PPO | Attending: Surgery | Admitting: Dietician

## 2020-10-09 ENCOUNTER — Encounter: Payer: Self-pay | Admitting: Dietician

## 2020-10-09 ENCOUNTER — Other Ambulatory Visit: Payer: Self-pay

## 2020-10-09 VITALS — Ht 67.0 in | Wt 202.8 lb

## 2020-10-09 DIAGNOSIS — E669 Obesity, unspecified: Secondary | ICD-10-CM | POA: Diagnosis not present

## 2020-10-09 DIAGNOSIS — Z6831 Body mass index (BMI) 31.0-31.9, adult: Secondary | ICD-10-CM | POA: Insufficient documentation

## 2020-10-09 NOTE — Progress Notes (Signed)
Nutrition Therapy for Post-Operative Bariatric Diet Follow-up visit:  9 months post-op sleeve gastrectomy Surgery  Medical Nutrition Therapy:  Appt start time: 1430 end time:  1500.  Anthropometrics: Weight: 202.8lbs Height: 5'7"  Date 11/22/19 01/10/20 07/10/20 10/09/20  BMI 41.7 37.4 31.8 31.8  Weight (lbs) 266.4 237.9 203.1 202.8  Skeletal muscle (lbs) 75 68.6 70.7 70.1  % body fat 50.2 48.3 38.2 38.2   Clinical: Medications: excitalopram Supplementation: bariatric multivitamins, calcium Health/ medical history changes: no changes GI symptoms: N/V/D/C: occasional mild constipation when fluid intake decreases Dumping Syndrome: no Hair loss: less recently  Dietary/ Lifestyle Progress: . Patient reports some increase in hunger since previous visit on 07/10/20. She did not eat more over holidays.  . Has not been as consistent with tracking food intake recently. . She reports less walking exercise during cold and recently inclement weather  Dietary recall: Eating pattern: 3 meals and 2 snacks dialy Dining out:  Breakfast: 2 eggs or protein shake or milk Snack: babybel cheese  Lunch: deli meat or leftover chicken; 1 slice bread with meat Snack: babybel cheese; deli meat  Dinner: 2-3ozmeat + veg ie green beans often occ very small amount of pasta Snack: none  Fluid intake: water plain or with sugar free/ diet cranberry juice; milk; protein shakes only occasionally now Estimated total protein intake: 60+grams daily Bariatric diet adherence:  . Using straws: yes . Drinking fluids during meals: no . Carbonated beverages: no  Recent physical activity:  Some walking; some home exercises with weights 30 minutes, 3x a week   Nutrition Intervention:   . Reviewed progress since previous visit on 07/10/20.  Marland Kitchen Discussed importance of maintaining low fat and low sugar choices, mostly low carb other than low-glycemic options in small portions. . Discussed possible causes of increased hunger  and managing with eating at regular intervals, increasing portions of low carb vegetables if needed, or adding small amounts of healthy fats to prolong satiety after eating.  . Encouraged increase in exercise frequency, duration, and/or intensity to avoid weight loss plateau and promote healthy metabolism.   Nutritional Diagnosis:  [place]-3.3 Overweight/obesity As related to history of excess calories and inadequate physical activity.  As evidenced by patient with current BMI of 31.8, following bariatric diet guidelines for ongoing weight loss after bariatric surgery.  Teaching Method Utilized:  Visual Auditory Hands on  Materials provided:  Phase 7 bariatric diet handout  Bariatric portion plate  Visit summary with goals and instructions.  Learning Readiness:   Change in progress  Barriers to learning/adherence to lifestyle change: none  Demonstrated degree of understanding via:  Teach Back      Plan: . Continue with bariatric diet guidelines . Goals listed under patient instructions . Return for 12 month post-op MNT on 12/18/20 at 11:30am

## 2020-10-09 NOTE — Patient Instructions (Signed)
·   Great job following bariatric diet throughout the months, keep it up!  Increase portions of low-carb veggies if needed to help control hunger, or add 1/2 - 1 teaspoon of oil, soft margarine, or salad dressing to food.   Continue to have something to eat every 3-4 hours during the day.   Keep up regular exercise, add time or days or intensity to restart weight loss and keep up metabolic rate.

## 2020-11-16 ENCOUNTER — Other Ambulatory Visit: Payer: Self-pay | Admitting: Adult Health

## 2020-11-16 DIAGNOSIS — I10 Essential (primary) hypertension: Secondary | ICD-10-CM

## 2020-11-16 DIAGNOSIS — M79661 Pain in right lower leg: Secondary | ICD-10-CM

## 2020-11-16 DIAGNOSIS — R0602 Shortness of breath: Secondary | ICD-10-CM

## 2020-12-02 ENCOUNTER — Encounter: Payer: Self-pay | Admitting: Dietician

## 2020-12-02 ENCOUNTER — Encounter: Payer: BC Managed Care – PPO | Attending: Surgery | Admitting: Dietician

## 2020-12-02 ENCOUNTER — Other Ambulatory Visit: Payer: Self-pay

## 2020-12-02 VITALS — Ht 67.0 in | Wt 201.0 lb

## 2020-12-02 DIAGNOSIS — Z6831 Body mass index (BMI) 31.0-31.9, adult: Secondary | ICD-10-CM | POA: Diagnosis not present

## 2020-12-02 DIAGNOSIS — E669 Obesity, unspecified: Secondary | ICD-10-CM

## 2020-12-02 DIAGNOSIS — Z09 Encounter for follow-up examination after completed treatment for conditions other than malignant neoplasm: Secondary | ICD-10-CM | POA: Diagnosis not present

## 2020-12-02 NOTE — Progress Notes (Signed)
Nutrition Therapy for Post-Operative Bariatric Diet Follow-up visit:  12 months post-op sleeve gastrectomy Surgery  Medical Nutrition Therapy:  Appt start time: 1315 end time:  1350.  Anthropometrics: Weight: 201.0lbs Height: 5'7"  Date 11/22/19 01/10/20 02/26/20 07/10/20 10/09/20 12/02/20  BMI 41.7 37.12 34.61 31.81 31.8 31.48  Weight (lbs) 266.4 237 221 203.1 202.8 201.0  Skeletal muscle (lbs) 75   70.7 70.1 69.7  % body fat 50.2 48.3  38.7 38.2 39.4   Clinical: Medications: escitalopram Supplementation: bariatric multivitamins, calcium Health/ medical history changes: no changes GI symptoms: N/V/D/C: none Dumping Syndrome: no Hair loss: no  Dietary/ Lifestyle Progress: . Patient reports variation in hunger from day to day, seems to increase in days before menstrual period. . She reports some increase in exercise with warmer weather and longer daylight. . She would like to increase variety in her diet, is feeling burned out on some foods such as eggs.   Dietary recall: Eating pattern: 3 meals and 2 snacks Dining out:  Breakfast: "old fashioned" oats 1/2 c. with few slices banana and 1-2Tbsp peanut butter Snack: cheese  Lunch: grilled chicken or deli meat or leftover meat Snack: same as am  Dinner: 3oz meat + low carb vegetables + occasional small amount of starch ie pasta; taco salad with refried beans, meat, lettuce, cheese; sometimes beans/ lentils Snack: usually none; occ almonds if up late  Fluid intake: usu 60oz water some with diet cranberry juice; some milk 8oz  60-68oz daily Estimated total protein intake: 55-65g daily Bariatric diet adherence:  . Using straws: yes . Drinking fluids during meals: no . Carbonated beverages: no  Recent physical activity:  Light exercises at desk (with weights) + walking 30 minutes 2x a week 48yr old daughter loves to walk   Nutrition Intervention:   . Reviewed progress since previous visit . Discussed possible factors for slow weight  loss.  . Discussed options for increasing variety; patient asks about carb foods, so discussed risks of increasing carb intake namely importance of avoiding gradual increase in portions and unhealthy carb choices. Advised high fiber, low sugar choices and only after consuming protein and vegetables. . Reviewed recommended portion distribution on plate.  . Discussed effect of exercise on muscle weight and metabolism. . Discussed calorie needs for ongoing weight loss at about 1200.  Nutritional Diagnosis:  [place]-3.3 Overweight/obesity As related to history of excess calories and inadequate physical activity.  As evidenced by patient with current BMI of 31.48, following bariatric diet guidelines for ongoing weight loss after bariatric surgery.  Teaching Method Utilized:  Visual Auditory Hands on  Materials provided:  Plate planner with food lists, modified for bariatric eating pattern  Visit summary with goals/ instructions  Learning Readiness:   Change in progress  Barriers to learning/adherence to lifestyle change: none  Demonstrated degree of understanding via:  Teach Back      Plan: . Patient to contact us by phone, email, or Mychart message with any questions or concerns. . No follow up scheduled at this time, patient to schedule in-person or virtual visit later if needed.

## 2020-12-02 NOTE — Patient Instructions (Signed)
   Great job sticking with bariatric guidelines!  Continue to increase exercise to keep muscle weight and metabolism up  Keep starchy foods to small portions, 15g carb or less per meal. Emphasize the lean protein and low-carb veggies.

## 2020-12-18 ENCOUNTER — Ambulatory Visit: Payer: BC Managed Care – PPO | Admitting: Dietician

## 2021-01-12 DIAGNOSIS — U071 COVID-19: Secondary | ICD-10-CM | POA: Diagnosis not present

## 2021-01-12 DIAGNOSIS — Z20822 Contact with and (suspected) exposure to covid-19: Secondary | ICD-10-CM | POA: Diagnosis not present

## 2021-01-21 DIAGNOSIS — L02612 Cutaneous abscess of left foot: Secondary | ICD-10-CM | POA: Diagnosis not present

## 2021-01-21 DIAGNOSIS — L600 Ingrowing nail: Secondary | ICD-10-CM | POA: Diagnosis not present

## 2021-01-21 DIAGNOSIS — M79674 Pain in right toe(s): Secondary | ICD-10-CM | POA: Diagnosis not present

## 2021-01-21 DIAGNOSIS — M79675 Pain in left toe(s): Secondary | ICD-10-CM | POA: Diagnosis not present

## 2021-02-09 DIAGNOSIS — L600 Ingrowing nail: Secondary | ICD-10-CM | POA: Diagnosis not present

## 2021-02-09 DIAGNOSIS — M79675 Pain in left toe(s): Secondary | ICD-10-CM | POA: Diagnosis not present

## 2021-02-09 DIAGNOSIS — L02612 Cutaneous abscess of left foot: Secondary | ICD-10-CM | POA: Diagnosis not present

## 2021-03-23 DIAGNOSIS — L600 Ingrowing nail: Secondary | ICD-10-CM | POA: Diagnosis not present

## 2021-03-23 DIAGNOSIS — M79674 Pain in right toe(s): Secondary | ICD-10-CM | POA: Diagnosis not present

## 2021-03-23 DIAGNOSIS — L02611 Cutaneous abscess of right foot: Secondary | ICD-10-CM | POA: Diagnosis not present

## 2021-04-05 DIAGNOSIS — Z6832 Body mass index (BMI) 32.0-32.9, adult: Secondary | ICD-10-CM | POA: Diagnosis not present

## 2021-04-05 DIAGNOSIS — F331 Major depressive disorder, recurrent, moderate: Secondary | ICD-10-CM | POA: Diagnosis not present

## 2021-04-05 DIAGNOSIS — F411 Generalized anxiety disorder: Secondary | ICD-10-CM | POA: Diagnosis not present

## 2021-05-03 DIAGNOSIS — F411 Generalized anxiety disorder: Secondary | ICD-10-CM | POA: Diagnosis not present

## 2021-05-03 DIAGNOSIS — F331 Major depressive disorder, recurrent, moderate: Secondary | ICD-10-CM | POA: Diagnosis not present

## 2021-06-24 DIAGNOSIS — F411 Generalized anxiety disorder: Secondary | ICD-10-CM | POA: Diagnosis not present

## 2021-06-24 DIAGNOSIS — F331 Major depressive disorder, recurrent, moderate: Secondary | ICD-10-CM | POA: Diagnosis not present

## 2021-06-24 DIAGNOSIS — Z903 Acquired absence of stomach [part of]: Secondary | ICD-10-CM | POA: Diagnosis not present

## 2021-06-30 DIAGNOSIS — L600 Ingrowing nail: Secondary | ICD-10-CM | POA: Diagnosis not present

## 2021-06-30 DIAGNOSIS — L02612 Cutaneous abscess of left foot: Secondary | ICD-10-CM | POA: Diagnosis not present

## 2021-06-30 DIAGNOSIS — M79675 Pain in left toe(s): Secondary | ICD-10-CM | POA: Diagnosis not present

## 2021-06-30 IMAGING — RF DG UGI W SINGLE CM
10 of 16 series · 12 of 24 positions shown · non-contrast
Comparison: CTA chest 08/22/2019.

CLINICAL DATA: 38-year-old female with obesity. Preoperative
planning.

EXAM:
UPPER GI SERIES WITH KUB
TECHNIQUE: After obtaining a scout radiograph a routine upper GI series was
performed using barium.
FLUOROSCOPY TIME:  Fluoroscopy Time:  1 minutes 12 seconds
Radiation Exposure Index (if provided by the fluoroscopic device):
41.9 mGy
Number of Acquired Spot Images:

[Series 3: cp_standard · 0.53mm/px · 2 of 31 frames shown (1 of 10)]
[frame 5/31]
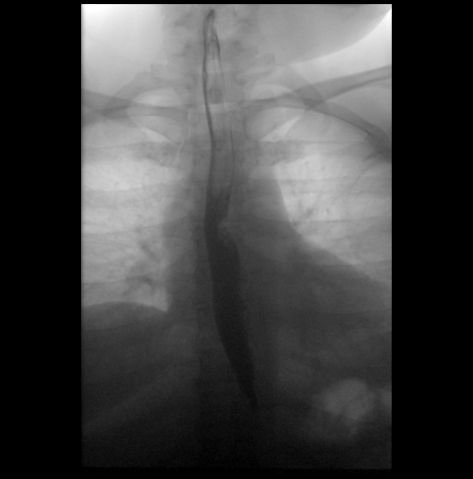
[frame 31/31]
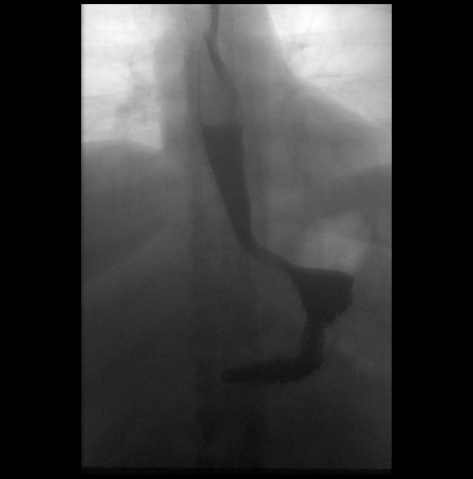

[Series 4: cp_standard · 0.53mm/px · 1 of 10 frames shown (2 of 10)]
[frame 10/10]
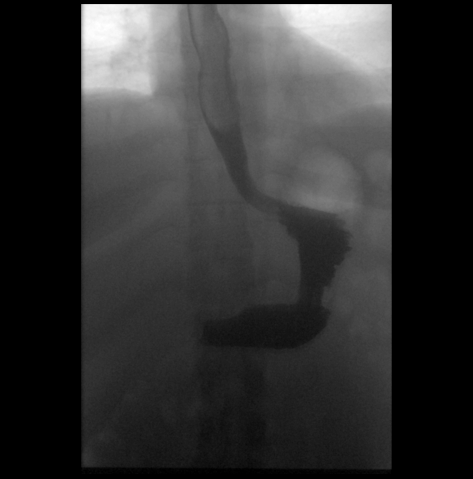

[Series 5: cp_standard · 0.53mm/px · 1 of 38 frames shown (3 of 10)]
[frame 33/38]
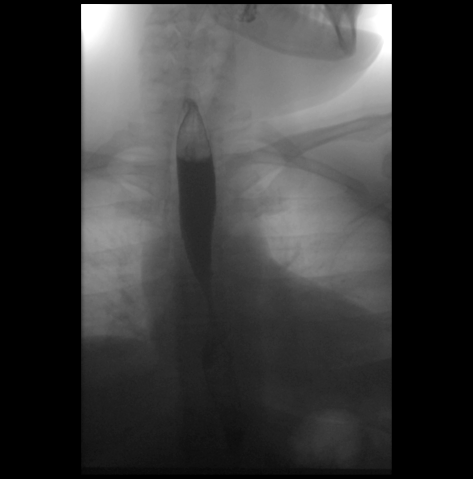

[Series 6: cp_standard · 0.53mm/px · 1 of 39 frames shown (4 of 10)]
[frame 20/39]
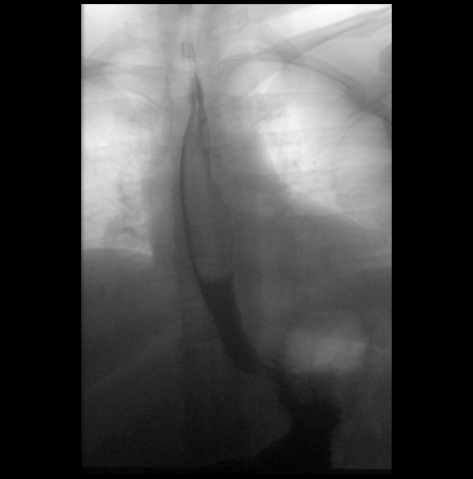

[Series 9: cp_standard · 0.26mm/px · 1 of 1 slices shown (5 of 10)]
[im 1/1]
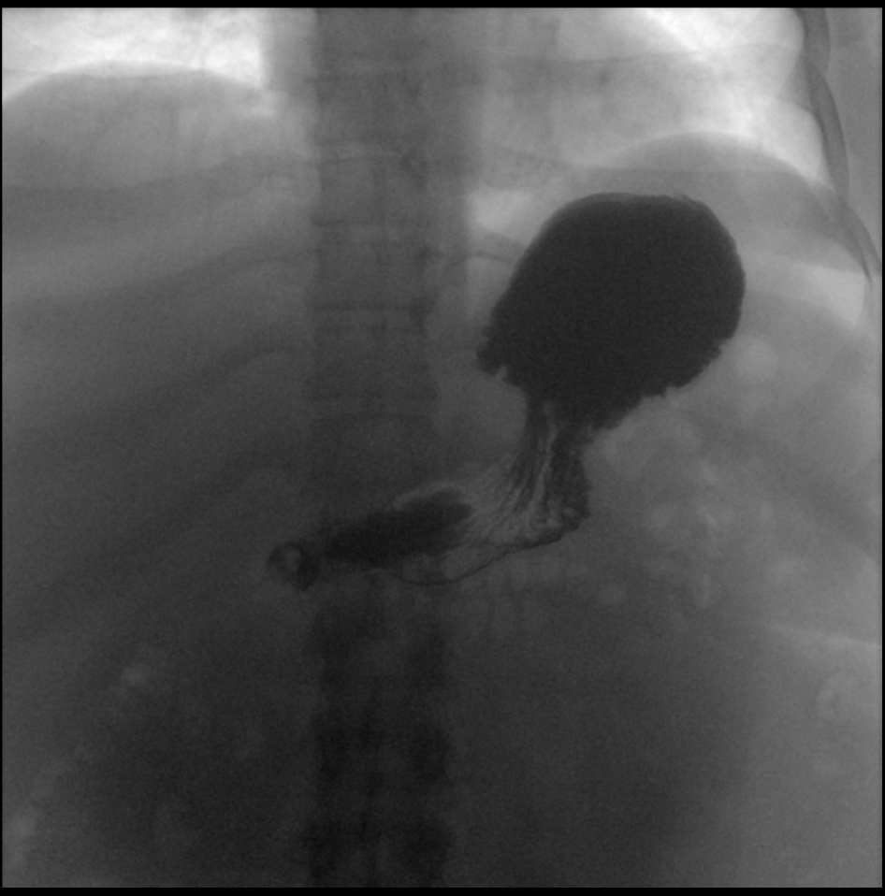

[Series 12: cp_standard · 0.17mm/px · 1 of 1 slices shown (6 of 10)]
[im 1/1]
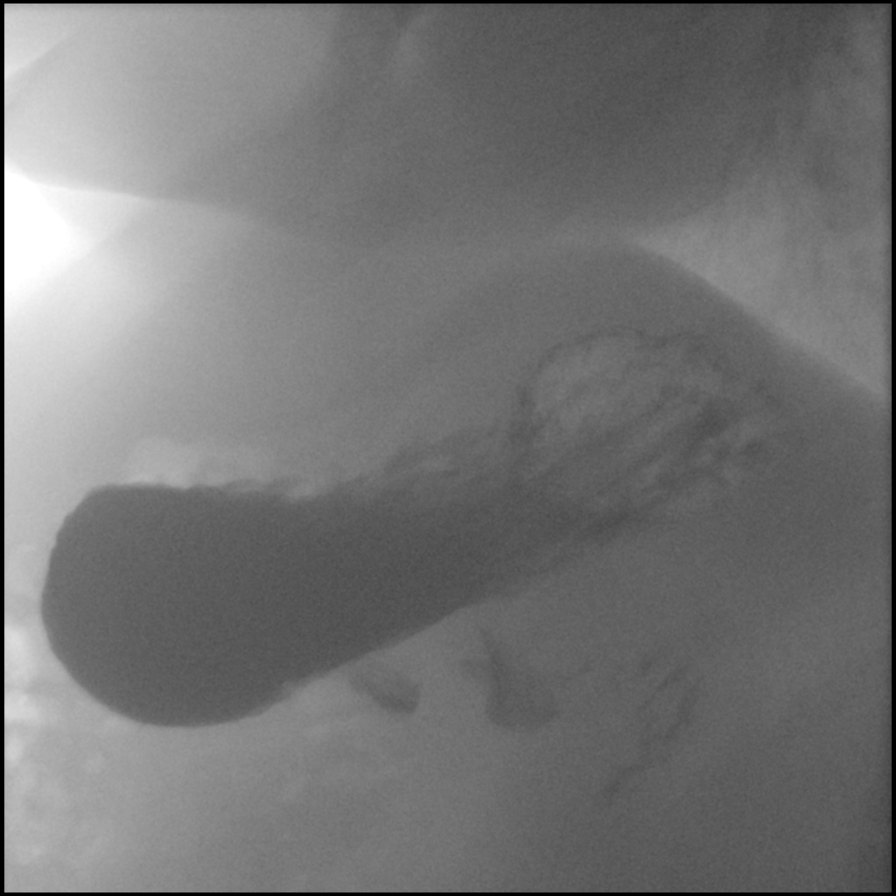

[Series 16: cp_standard · 0.35mm/px · 2 of 16 frames shown (7 of 10)]
[frame 3/16]
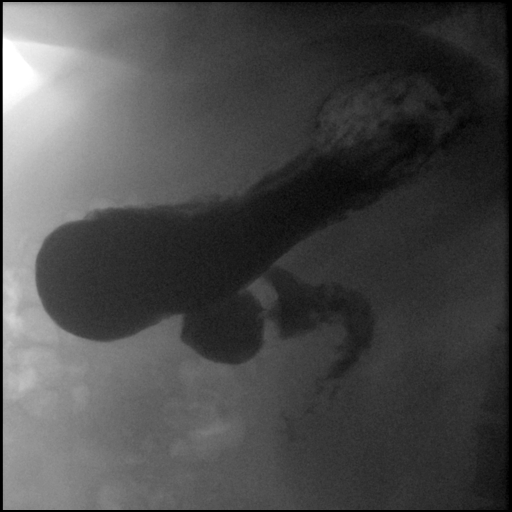
[frame 14/16]
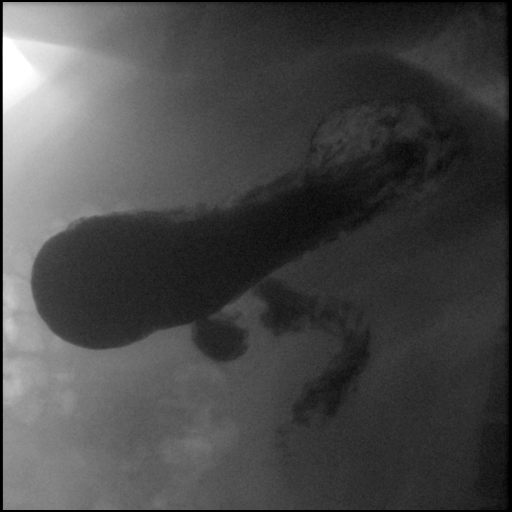

[Series 17: cp_standard · 0.34mm/px · 1 of 23 frames shown (8 of 10)]
[frame 23/23]
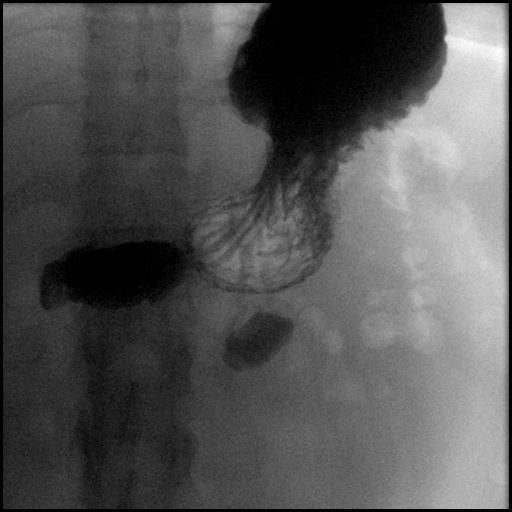

[Series 20: cp_standard · 0.26mm/px · 1 of 1 slices shown (9 of 10)]
[im 1/1]
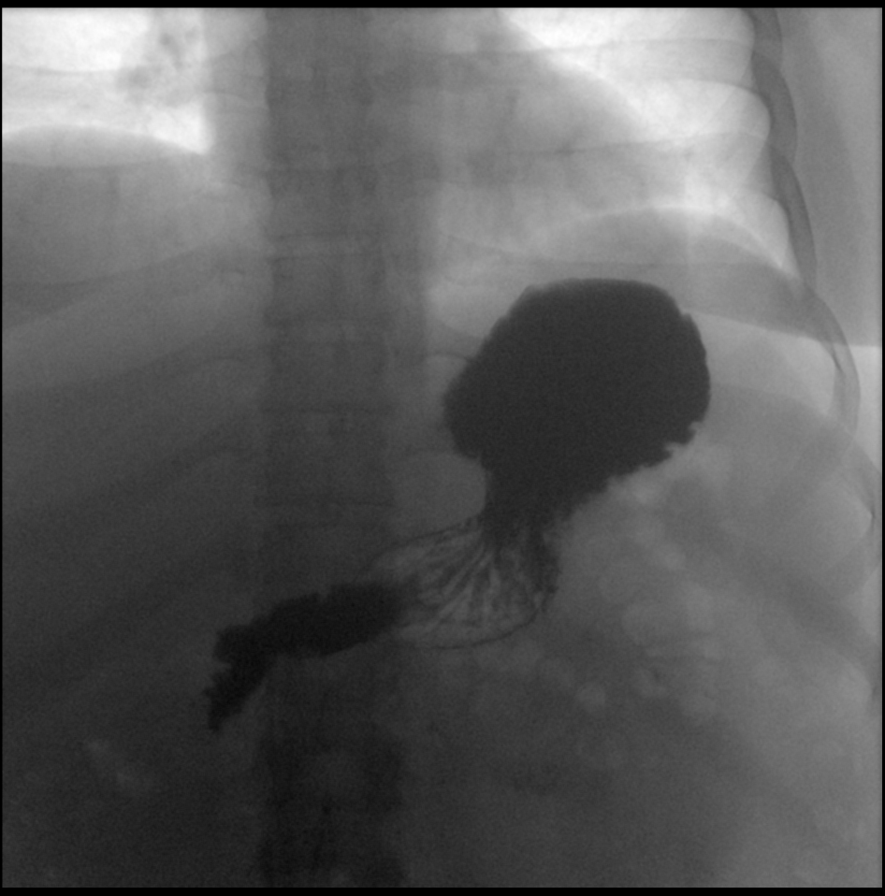

[Series 24: cp_standard · 0.26mm/px · 1 of 1 slices shown (10 of 10)]
[im 1/1]
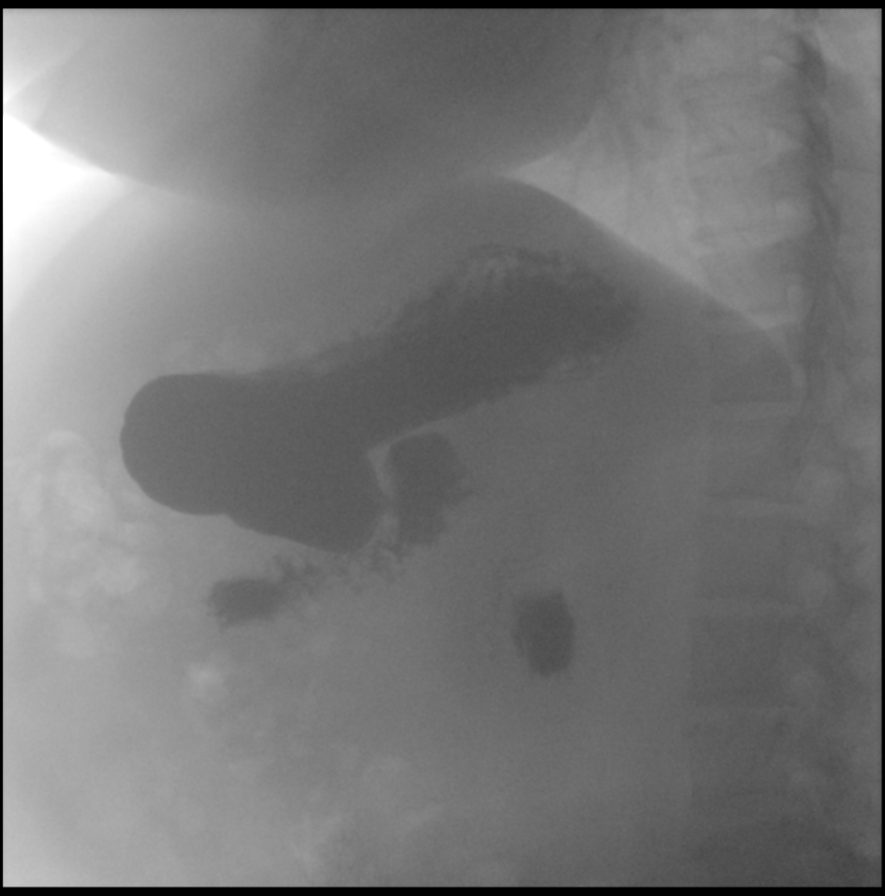

[12 of 24 positions shown; findings below may reference images not displayed]

FINDINGS: Preprocedural scalp view of the abdomen. Negative lung bases. Normal
bowel gas pattern. Normal abdominal and pelvic visceral contours
with pelvic phleboliths. No acute osseous abnormality identified.

No obstruction to the forward flow of contrast throughout the
esophagus and into the stomach. Normal esophageal course and
contour. Normal gastroesophageal junction.

Normal gastric contour. Relatively prompt gastric emptying. Normal
retroperitoneal appearing course of the duodenum C-loop, and the
ligament of [place] appears normally located. Unremarkable visible
proximal small bowel. No gastroesophageal reflux occurred
spontaneously or was elicited.
IMPRESSION: Normal preoperative upper GI.

## 2021-07-21 DIAGNOSIS — R635 Abnormal weight gain: Secondary | ICD-10-CM | POA: Diagnosis not present

## 2021-07-21 DIAGNOSIS — Z9884 Bariatric surgery status: Secondary | ICD-10-CM | POA: Diagnosis not present

## 2021-08-03 DIAGNOSIS — M79675 Pain in left toe(s): Secondary | ICD-10-CM | POA: Diagnosis not present

## 2021-08-03 DIAGNOSIS — L600 Ingrowing nail: Secondary | ICD-10-CM | POA: Diagnosis not present

## 2021-08-03 DIAGNOSIS — L02612 Cutaneous abscess of left foot: Secondary | ICD-10-CM | POA: Diagnosis not present

## 2021-08-03 DIAGNOSIS — M79674 Pain in right toe(s): Secondary | ICD-10-CM | POA: Diagnosis not present

## 2021-08-24 DIAGNOSIS — L600 Ingrowing nail: Secondary | ICD-10-CM | POA: Diagnosis not present

## 2021-08-24 DIAGNOSIS — M79675 Pain in left toe(s): Secondary | ICD-10-CM | POA: Diagnosis not present

## 2021-08-24 DIAGNOSIS — M79674 Pain in right toe(s): Secondary | ICD-10-CM | POA: Diagnosis not present

## 2022-07-04 ENCOUNTER — Encounter (INDEPENDENT_AMBULATORY_CARE_PROVIDER_SITE_OTHER): Payer: Self-pay

## 2023-07-28 ENCOUNTER — Encounter (HOSPITAL_COMMUNITY): Payer: Self-pay | Admitting: *Deleted
